# Patient Record
Sex: Female | Born: 1955
Health system: Southern US, Community
[De-identification: ages and names within clinical notes are randomized; demographics above are authoritative.]

## PROBLEM LIST (undated history)

## (undated) DIAGNOSIS — Z801 Family history of malignant neoplasm of trachea, bronchus and lung: Secondary | ICD-10-CM

## (undated) DIAGNOSIS — Z8042 Family history of malignant neoplasm of prostate: Secondary | ICD-10-CM

## (undated) DIAGNOSIS — Z800 Family history of malignant neoplasm of digestive organs: Secondary | ICD-10-CM

## (undated) DIAGNOSIS — E039 Hypothyroidism, unspecified: Secondary | ICD-10-CM

## (undated) DIAGNOSIS — E7800 Pure hypercholesterolemia, unspecified: Secondary | ICD-10-CM

## (undated) DIAGNOSIS — Z8051 Family history of malignant neoplasm of kidney: Secondary | ICD-10-CM

## (undated) DIAGNOSIS — Z808 Family history of malignant neoplasm of other organs or systems: Secondary | ICD-10-CM

## (undated) DIAGNOSIS — Z8041 Family history of malignant neoplasm of ovary: Secondary | ICD-10-CM

## (undated) DIAGNOSIS — Z806 Family history of leukemia: Secondary | ICD-10-CM

## (undated) HISTORY — DX: Family history of malignant neoplasm of kidney: Z80.51

## (undated) HISTORY — PX: TUBAL LIGATION: SHX77

## (undated) HISTORY — DX: Family history of malignant neoplasm of trachea, bronchus and lung: Z80.1

## (undated) HISTORY — DX: Family history of malignant neoplasm of other organs or systems: Z80.8

## (undated) HISTORY — DX: Family history of malignant neoplasm of prostate: Z80.42

## (undated) HISTORY — DX: Hypothyroidism, unspecified: E03.9

## (undated) HISTORY — DX: Family history of malignant neoplasm of ovary: Z80.41

## (undated) HISTORY — DX: Family history of malignant neoplasm of digestive organs: Z80.0

## (undated) HISTORY — DX: Family history of leukemia: Z80.6

---

## 1999-09-28 ENCOUNTER — Encounter: Payer: Self-pay | Admitting: Neurosurgery

## 1999-10-01 ENCOUNTER — Encounter: Payer: Self-pay | Admitting: Neurosurgery

## 1999-10-01 ENCOUNTER — Ambulatory Visit (HOSPITAL_COMMUNITY): Admission: RE | Admit: 1999-10-01 | Discharge: 1999-10-02 | Payer: Self-pay | Admitting: Neurosurgery

## 1999-10-10 ENCOUNTER — Emergency Department (HOSPITAL_COMMUNITY): Admission: EM | Admit: 1999-10-10 | Discharge: 1999-10-10 | Payer: Self-pay | Admitting: Emergency Medicine

## 2000-02-02 HISTORY — PX: BACK SURGERY: SHX140

## 2002-12-10 ENCOUNTER — Emergency Department (HOSPITAL_COMMUNITY): Admission: AD | Admit: 2002-12-10 | Discharge: 2002-12-10 | Payer: Self-pay | Admitting: Family Medicine

## 2008-02-21 ENCOUNTER — Ambulatory Visit (HOSPITAL_COMMUNITY): Admission: RE | Admit: 2008-02-21 | Discharge: 2008-02-21 | Payer: Self-pay | Admitting: Family Medicine

## 2009-04-23 ENCOUNTER — Ambulatory Visit (HOSPITAL_COMMUNITY): Admission: RE | Admit: 2009-04-23 | Discharge: 2009-04-23 | Payer: Self-pay | Admitting: Family Medicine

## 2009-09-15 ENCOUNTER — Ambulatory Visit (HOSPITAL_COMMUNITY): Admission: RE | Admit: 2009-09-15 | Discharge: 2009-09-15 | Payer: Self-pay | Admitting: Family Medicine

## 2010-06-19 NOTE — Op Note (Signed)
[place]  Patient:    Dana Holder, Dana Holder                          MRN: [id number] Proc. Date: 11/12/99 Adm. Date:  [id number] Disc. Date: [id number] Attending:  Annamarie Dawley                           Operative Report  PREOPERATIVE DIAGNOSES:  Severe degenerative disk disease L5-S1 with mechanical instability and bilateral S1 radiculopathy.  POSTOPERATIVE DIAGNOSES:  Severe degenerative disk disease L5-S1 with mechanical instability and bilateral S1 radiculopathy.  PROCEDURE:  Decompressive lumbar laminectomy at L5-S1, microscopic diskectomy at L5-S1, microscopic dissection of the l5 and S1 nerve roots bilaterally, tangent allograft interbody arthrodesis using 8 x 26 mm wedges, posterolateral arthrodesis and screw placement using six 5 x 40 screws in the sacrum and six 5 x 45 screws in L5 and 1/4 inch rods.  SURGEON:  Dr. Donalee Citrin.  FIRST ASSISTANT:  Dr. Aliene Beams.  ANESTHESIA:  General endotracheal, IV fluids.  HISTORY OF PRESENT ILLNESS:  The patient is a very pleasant 55 year old female whose had longstanding low back pain that has gotten acutely worse in the lAST couple of months whose failed all measures of anti-inflammatories and physical therapy. Her pain was severely debilitating. I essentially went over her options with her and has agreed to proceed with surgery. Her symptomatology is back greater than leg pain with bilateral S1 radiculopathy and severe pain in her back that is worse with axial loading. The patient was brought to the OR where ______ joint anesthesia was administered.  Prone on the Wilson frame, her back was prepped and draped in a sterile fashion. An incision was made a 20 blade scalpel, low lithotomy incision was taken down the subcutaneous tissue. Subperiosteal dissection was carried out in the lamina of the L5 and S1 bilaterally. Intraoperative x-ray confirmed the L5-S1 interspace. Subperiosteal dissection was  carried out in the lateral aspect exposing the TPs of L5 and S1 bilaterally and a Leksell rongeur was used to removed the spinous process of L5 and the remainder of the laminectomy at L5 was completed with free implement of the Kerrison punches and the ______ drill bit was also used to facilitate the medial facetectomy. Part of the superior aspect of the S1 lamina was also removed. The ligamentum flavum was removed, dura was visualized. The remainder of the medial fascial joint was continued using 3 and 4 mm Kerrison punches to complete the complete medial facetectomy. The L5 nerve root was identified, the foraminotomy was completed with a 3 mm Kerrison punch as well as the S1 nerve root. Attention was then taken to pedicle screw placement. The fluoroscopy was brought in using fluoroscopic guidance ______. Pilot holes were drilled in the L5 an S1 pedicle screw holes bilaterally. A gear shift was used to probe out the pedicle, tapped with a 5 bite tap and ______ followed by 45 screws and placed at L5 and six 5 x 40 screws placed at S1, all confirmed by good localization with the fluoroscopy and all pedicle holes probed with the pedicle probe to ensure competence in all sites. After the screw placed, the operating microscope was draped and brought in the field and under microscopic illumination the abdomen was ______ with bipolar electrocautery over the interspace with L5-S1. The remainder of the L5 and S1 foraminotomies were continued with a 3 mm  Kerrison punch. The interspace was entered and using an 8 mm distractor placed on the interspace on the left. The right side of the L5-S1 interspace was cleaned out with the small cutter and chisel sequentially to prepare the endplate and then an 8 mm x 26 mm change in allograft was inserted. Then the distractor was removed from the patients left side and then this interspace was also cleaned out using small 8 mm cutter and chisel as well. Down  going Epstein curette and ream curette were used to clean out the remainder of the medial aspect of the interspace. The endplates were prepared, the remainder of the disk fragments were removed and the 8 mm x 26 mm change in allograft was soaked. Autograft was packed against the opposite allograft and then after the autograft was packed medially, the allograft was inserted on the patients left side. These ______ approximately 2-3 mm deep in the posterior margin of the vertebral body confirmed location with fluoroscopy. The wound was copiously irrigated and the autograft was then packed along the posterolateral aspect over the transverse process of L5 and S1 bilaterally. Rods were inserted ______ were anchored down. The L5 ______ was compressed against S1. These  ______ were also tied at L5. A median Hemovac drain was placed. Hemostasis was maintained. Gelfoam was overlaid on top of the dura and the wound was closed with #0 Vicryl in simple fashion, 2-0 Vicryl in the subcutaneous tissue and the skin was closed with a running 4-0 subcuticular. Benzoin and Steri-Strips were applied at the end of the case. Sponge, needle and instrument counts were correct. The patient went to the recovery room in stable condition. DD:  11/12/99 TD:  11/13/99 Job: [id number]

## 2010-11-13 ENCOUNTER — Encounter: Payer: Self-pay | Admitting: *Deleted

## 2010-11-13 ENCOUNTER — Emergency Department (HOSPITAL_COMMUNITY): Payer: 59

## 2010-11-13 ENCOUNTER — Emergency Department (HOSPITAL_COMMUNITY)
Admission: EM | Admit: 2010-11-13 | Discharge: 2010-11-13 | Disposition: A | Discharge status: Home / Self Care | Payer: 59 | Attending: Emergency Medicine | Admitting: Emergency Medicine

## 2010-11-13 ENCOUNTER — Ambulatory Visit (HOSPITAL_COMMUNITY)
Admission: RE | Admit: 2010-11-13 | Discharge: 2010-11-13 | Disposition: A | Discharge status: Home / Self Care | Payer: 59 | Source: Ambulatory Visit | Attending: Nurse Practitioner | Admitting: Nurse Practitioner

## 2010-11-13 DIAGNOSIS — N949 Unspecified condition associated with female genital organs and menstrual cycle: Secondary | ICD-10-CM | POA: Insufficient documentation

## 2010-11-13 DIAGNOSIS — D259 Leiomyoma of uterus, unspecified: Secondary | ICD-10-CM | POA: Insufficient documentation

## 2010-11-13 DIAGNOSIS — Z87891 Personal history of nicotine dependence: Secondary | ICD-10-CM | POA: Insufficient documentation

## 2010-11-13 DIAGNOSIS — N938 Other specified abnormal uterine and vaginal bleeding: Secondary | ICD-10-CM | POA: Insufficient documentation

## 2010-11-13 LAB — CBC
HCT: 38.5 % (ref 36.0–46.0)
Hemoglobin: 13.1 g/dL (ref 12.0–15.0)
MCHC: 34 g/dL (ref 30.0–36.0)
MCV: 90.2 fL (ref 78.0–100.0)
RDW: 13.3 % (ref 11.5–15.5)
WBC: 9.5 10*3/uL (ref 4.0–10.5)

## 2010-11-13 LAB — POCT PREGNANCY, URINE: Preg Test, Ur: NEGATIVE

## 2010-11-13 LAB — WET PREP, GENITAL
Clue Cells Wet Prep HPF POC: NONE SEEN
Yeast Wet Prep HPF POC: NONE SEEN

## 2010-11-13 MED ORDER — MEGESTROL ACETATE 40 MG PO TABS: 40.0000 mg | ORAL_TABLET | Freq: Three times a day (TID) | ORAL | Status: AC

## 2010-11-13 NOTE — ED Provider Notes (Signed)
History     CSN: [id number] Arrival date & time: 11/13/2010  2:07 PM  Chief Complaint  Patient presents with  . Vaginal Bleeding    HPI Dana Holder is a 55 y.o. female who presents to the ED for heavy vaginal bleeding that started yesterday. Stopped having periods two years ago. The bleeding started yesterday as light and then today has gotten heavier.  Went to Dr. Phillips Odor today and was sent to the ED. Patient states she had a sister that died of cervical cancer. Feeling tired the past few days. Hx of lesions on the cervix that were removed by Dr. Emelda Fear in the past. No recent problems. The history was provided by the patient.  History reviewed. No pertinent past medical history.  Past Surgical History  Procedure Date  . Back surgery   . Tubal ligation     No family history on file.  History  Substance Use Topics  . Smoking status: Former Games developer  . Smokeless tobacco: Not on file  . Alcohol Use: No    OB History    Grav Para Term Preterm Abortions TAB SAB Ect Mult Living                  Review of Systems  Constitutional: Positive for fatigue. Negative for fever, chills and diaphoresis.  HENT: Negative for ear pain, congestion, sore throat, facial swelling, neck pain, neck stiffness, dental problem and sinus pressure.   Eyes: Negative for photophobia, pain and discharge.  Respiratory: Negative for cough, chest tightness and wheezing.   Cardiovascular: Negative.   Gastrointestinal: Positive for abdominal pain and abdominal distention. Negative for nausea, vomiting, diarrhea and constipation.  Genitourinary: Positive for vaginal bleeding, vaginal discharge and pelvic pain. Negative for dysuria, frequency, flank pain, difficulty urinating and vaginal pain.  Musculoskeletal: Positive for back pain. Negative for myalgias and gait problem.  Skin: Negative for color change and rash.  Neurological: Negative for dizziness, speech difficulty, weakness, light-headedness,  numbness and headaches.  Psychiatric/Behavioral: Negative for confusion and agitation.    Allergies  Review of patient's allergies indicates no known allergies.  Home Medications  No current outpatient prescriptions on file.  BP 129/71  Pulse 64  Temp(Src) 98.4 F (36.9 C) (Oral)  Resp 20  Ht 5\' 5"  (1.651 m)  Wt 249 lb (112.946 kg)  BMI 41.44 kg/m2  SpO2 99%  Physical Exam  Nursing note and vitals reviewed. Constitutional: She is oriented to person, place, and time. She appears well-developed and well-nourished.  HENT:  Head: Normocephalic.  Eyes: EOM are normal.  Neck: Neck supple.  Pulmonary/Chest: Effort normal.  Abdominal: Soft.       Minimal tenderness lower abdomen.  Genitourinary:       External genitalia without lesions.  Moderate blood vaginal vault, no cervical motion tenderness. Uterus enlarged.  Musculoskeletal: Normal range of motion.  Neurological: She is alert and oriented to person, place, and time. No cranial nerve deficit.  Skin: Skin is warm and dry.    ED Course  Procedures (including critical care time) US Transvaginal Non-ob  11/13/2010  *RADIOLOGY REPORT*  Clinical Data: Heavy post menopausal vaginal bleeding. History of uterine fibroids. No hormone replacement therapy.  TRANSABDOMINAL AND TRANSVAGINAL ULTRASOUND OF PELVIS Technique:  Both transabdominal and transvaginal ultrasound examinations of the pelvis were performed. Transabdominal technique was performed for global imaging of the pelvis including uterus, ovaries, adnexal regions, and pelvic cul-de-sac.  Comparison: Pelvic ultrasound 02/21/2008.   It was necessary to proceed with endovaginal  exam following the transabdominal exam to visualize the uterus and ovaries to better advantage.  Findings:  Uterus: Measures 12.1 x 6.1 x 7.5 cm.  There is diffuse myometrial heterogeneity with several intramural fibroids.  The largest is located in the right fundal region, measuring 3.5 x 3.2 x 2.2 cm. No  exophytic fibroids are identified.  Endometrium: Heterogeneously thickened to 1.6 cm.  There is associated blood flow with color Doppler.  Right ovary:  Suboptimally visualized but grossly normal in appearance, measuring 2.4 x 2.0 x 2.5 cm.  Left ovary: Only visualized on the transabdominal study.  No demonstrated abnormality.  Measures 3.4 x 2.7 x 2.2 cm.  Other findings: No free fluid  IMPRESSION:  1.  Abnormal thickening of the endometrium for a post menopausal patient with vaginal bleeding and no hormone replacement therapy. Endometrial hyperplasia and carcinoma cannot be excluded.  Tissue sampling is recommended. 2.  Enlarged uterus with multiple fibroids similar to prior examinations. 3.  No adnexal abnormalities identified.  Original Report Authenticated By: Gerrianne Scale, M.D.   US Pelvis Complete  11/13/2010  *RADIOLOGY REPORT*  Clinical Data: Heavy post menopausal vaginal bleeding. History of uterine fibroids. No hormone replacement therapy.  TRANSABDOMINAL AND TRANSVAGINAL ULTRASOUND OF PELVIS Technique:  Both transabdominal and transvaginal ultrasound examinations of the pelvis were performed. Transabdominal technique was performed for global imaging of the pelvis including uterus, ovaries, adnexal regions, and pelvic cul-de-sac.  Comparison: Pelvic ultrasound 02/21/2008.   It was necessary to proceed with endovaginal exam following the transabdominal exam to visualize the uterus and ovaries to better advantage.  Findings:  Uterus: Measures 12.1 x 6.1 x 7.5 cm.  There is diffuse myometrial heterogeneity with several intramural fibroids.  The largest is located in the right fundal region, measuring 3.5 x 3.2 x 2.2 cm. No exophytic fibroids are identified.  Endometrium: Heterogeneously thickened to 1.6 cm.  There is associated blood flow with color Doppler.  Right ovary:  Suboptimally visualized but grossly normal in appearance, measuring 2.4 x 2.0 x 2.5 cm.  Left ovary: Only visualized on the  transabdominal study.  No demonstrated abnormality.  Measures 3.4 x 2.7 x 2.2 cm.  Other findings: No free fluid  IMPRESSION:  1.  Abnormal thickening of the endometrium for a post menopausal patient with vaginal bleeding and no hormone replacement therapy. Endometrial hyperplasia and carcinoma cannot be excluded.  Tissue sampling is recommended. 2.  Enlarged uterus with multiple fibroids similar to prior examinations. 3.  No adnexal abnormalities identified.  Original Report Authenticated By: Gerrianne Scale, M.D.   Results for orders placed during the hospital encounter of 11/13/10 (from the past 24 hour(s))  CBC     Status: Normal   Collection Time   11/13/10  3:05 PM      Component Value Range   WBC 9.5  4.0 - 10.5 (K/uL)   RBC 4.27  3.87 - 5.11 (MIL/uL)   Hemoglobin 13.1  12.0 - 15.0 (g/dL)   HCT 13.2  44.0 - 10.2 (%)   MCV 90.2  78.0 - 100.0 (fL)   MCH 30.7  26.0 - 34.0 (pg)   MCHC 34.0  30.0 - 36.0 (g/dL)   RDW 72.5  36.6 - 44.0 (%)   Platelets 230  150 - 400 (K/uL)  WET PREP, GENITAL     Status: Abnormal   Collection Time   11/13/10  3:28 PM      Component Value Range   Yeast, Wet Prep NONE SEEN  NONE SEEN  Trich, Wet Prep NONE SEEN  NONE SEEN    Clue Cells, Wet Prep NONE SEEN  NONE SEEN    WBC, Wet Prep HPF POC FEW (*) NONE SEEN   POCT PREGNANCY, URINE     Status: Normal   Collection Time   11/13/10  5:19 PM      Component Value Range   Preg Test, Ur NEGATIVE     Assessment: dysfunctional uterine bleeding   Uterine fibroids  Plan:  Consult with Dr. Despina Hidden   Megace 40 mg. Po tid #30   Appointment in the office Monday - pt. To call  MDM   Kerrie Buffalo, NP 11/13/10 [address] [place], NP 11/14/10 [phone]

## 2010-11-13 NOTE — ED Notes (Signed)
Taken for ultrasound.  Stable

## 2010-11-13 NOTE — ED Notes (Signed)
Vaginal bleeding onset last night, states is is like a heavy period

## 2010-11-13 NOTE — ED Notes (Signed)
States Dr Phillips Odor told her she would get an IV to stop the bleeding

## 2010-11-13 NOTE — ED Notes (Signed)
States her last period up until this time was two years ago--began having "heavy" vaginal bleeding last p.m.--Has used over 8 pads today and c/o cramping--rates pain 3 on 1-10 scale---Sent here from her MD's office for further evaluation.

## 2010-11-14 LAB — GC/CHLAMYDIA PROBE AMP, GENITAL: Chlamydia, DNA Probe: NEGATIVE

## 2010-11-17 NOTE — ED Provider Notes (Signed)
Medical screening examination/treatment/procedure(s) were performed by non-physician practitioner and as supervising physician I was immediately available for consultation/collaboration.  Hurman Horn, MD 11/17/10 [phone]

## 2010-12-13 ENCOUNTER — Other Ambulatory Visit: Payer: Self-pay | Admitting: Obstetrics & Gynecology

## 2010-12-22 ENCOUNTER — Encounter (HOSPITAL_COMMUNITY)
Admission: RE | Admit: 2010-12-22 | Discharge: 2010-12-22 | Disposition: A | Discharge status: Home / Self Care | Payer: 59 | Source: Ambulatory Visit | Attending: Obstetrics & Gynecology | Admitting: Obstetrics & Gynecology

## 2010-12-22 ENCOUNTER — Encounter (HOSPITAL_COMMUNITY): Payer: Self-pay

## 2010-12-22 ENCOUNTER — Encounter (HOSPITAL_COMMUNITY): Payer: Self-pay | Admitting: Pharmacy Technician

## 2010-12-22 LAB — CBC
Hemoglobin: 12.5 g/dL (ref 12.0–15.0)
MCHC: 33 g/dL (ref 30.0–36.0)
RBC: 4.22 MIL/uL (ref 3.87–5.11)

## 2010-12-22 LAB — COMPREHENSIVE METABOLIC PANEL
ALT: 9 U/L (ref 0–35)
Alkaline Phosphatase: 70 U/L (ref 39–117)
GFR calc Af Amer: 75 mL/min — ABNORMAL LOW (ref >90)
Glucose, Bld: 105 mg/dL — ABNORMAL HIGH (ref 70–99)
Potassium: 4.4 mEq/L (ref 3.5–5.1)
Sodium: 141 mEq/L (ref 135–145)
Total Protein: 6.8 g/dL (ref 6.0–8.3)

## 2010-12-22 LAB — URINALYSIS, ROUTINE W REFLEX MICROSCOPIC
Bilirubin Urine: NEGATIVE
Glucose, UA: NEGATIVE mg/dL
Specific Gravity, Urine: 1.02 (ref 1.005–1.030)
Urobilinogen, UA: 0.2 mg/dL (ref 0.0–1.0)
pH: 6 (ref 5.0–8.0)

## 2010-12-22 LAB — URINE MICROSCOPIC-ADD ON

## 2010-12-22 NOTE — Patient Instructions (Addendum)
[address] AEMILIA DEDRICK  12/22/2010   Your procedure is scheduled on: 12-30-2010  Report to [facility] at  900 AM.  Call this number if you have problems the morning of surgery: [phone]   Remember:   Do not eat food:After Midnight.  Do not drink clear liquids: After Midnight.  Take these medicines the morning of surgery with A SIP OF WATER: none   Do not wear jewelry, make-up or nail polish.  Do not wear lotions, powders, or perfumes. You may wear deodorant.  Do not shave 48 hours prior to surgery.  Do not bring valuables to the hospital.  Contacts, dentures or bridgework may not be worn into surgery.  Leave suitcase in the car. After surgery it may be brought to your room.  For patients admitted to the hospital, checkout time is 11:00 AM the day of discharge.   Patients discharged the day of surgery will not be allowed to drive home.  Name and phone number of your driver: family Special Instructions: CHG Shower Use Special Wash: 1/2 bottle night before surgery and 1/2 bottle morning of surgery.   Please read over the following fact sheets that you were given: Pain Booklet, MRSA Information, Surgical Site Infection Prevention, Anesthesia Post-op Instructions and Care and Recovery After Surgery Endometrial Ablation Endometrial ablation removes the lining of the uterus (endometrium). It is usually a same day, outpatient treatment. Ablation helps avoid major surgery (such as a hysterectomy). A hysterectomy is removal of the cervix and uterus. Endometrial ablation has less risk and complications, has a shorter recovery period and is less expensive. After endometrial ablation, most women will have little or no menstrual bleeding. You may not keep your fertility. Pregnancy is no longer likely after this procedure but if you are pre-menopausal, you still need to use a reliable method of birth control following the procedure because pregnancy can occur. REASONS TO HAVE THE PROCEDURE MAY  INCLUDE:  Heavy periods.   Bleeding that is causing anemia.   Anovulatory bleeding, very irregular, bleeding.   Bleeding submucous fibroids (on the lining inside the uterus) if they are smaller than 3 centimeters.  REASONS NOT TO HAVE THE PROCEDURE MAY INCLUDE:  You wish to have more children.   You have a pre-cancerous or cancerous problem. The cause of any abnormal bleeding must be diagnosed before having the procedure.   You have pain coming from the uterus.   You have a submucus fibroid larger than 3 centimeters.   You recently had a baby.   You recently had an infection in the uterus.   You have a severe retro-flexed, tipped uterus and cannot insert the instrument to do the ablation.   You had a Cesarean section or deep major surgery on the uterus.   The inner cavity of the uterus is too large for the endometrial ablation instrument.  RISKS AND COMPLICATIONS   Perforation of the uterus.   Bleeding.   Infection of the uterus, bladder or vagina.   Injury to surrounding organs.   Cutting the cervix.   An air bubble to the lung (air embolus).   Pregnancy following the procedure.   Failure of the procedure to help the problem requiring hysterectomy.   Decreased ability to diagnose cancer in the lining of the uterus.  BEFORE THE PROCEDURE  The lining of the uterus must be tested to make sure there is no pre-cancerous or cancer cells present.   Medications may be given to make the lining of the uterus  thinner.   Ultrasound may be used to evaluate the size and look for abnormalities of the uterus.   Future pregnancy is not desired.  PROCEDURE  There are different ways to destroy the lining of the uterus.   Resectoscope - radio frequency-alternating electric current is the most common one used.   Cryotherapy - freezing the lining of the uterus.   Heated Free Liquid - heated salt (saline) solution inserted into the uterus.   Microwave - uses high energy  microwaves in the uterus.   Thermal Balloon - a catheter with a balloon tip is inserted into the uterus and filled with heated fluid.  Your caregiver will talk with you about the method used in this clinic. They will also instruct you on the pros and cons of the procedure. Endometrial ablation is performed along with a procedure called operative hysteroscopy. A narrow viewing tube is inserted through the birth canal (vagina) and through the cervix into the uterus. A tiny camera attached to the viewing tube (hysteroscope) allows the uterine cavity to be shown on a TV monitor during surgery. Your uterus is filled with a harmless liquid to make the procedure easier. The lining of the uterus is then removed. The lining can also be removed with a resectoscope which allows your surgeon to cut away the lining of the uterus under direct vision. Usually, you will be able to go home within an hour after the procedure. HOME CARE INSTRUCTIONS   Do not drive for 24 hours.   No tampons, douching or intercourse for 2 weeks or until your caregiver approves.   Rest at home for 24 to 48 hours. You may then resume normal activities unless told differently by your caregiver.   Take your temperature two times a day for 4 days, and record it.   Take any medications your caregiver has ordered, as directed.   Use some form of contraception if you are pre-menopausal and do not want to get pregnant.  Bleeding after the procedure is normal. It varies from light spotting and mildly watery to bloody discharge for 4 to 6 weeks. You may also have mild cramping. Only take over-the-counter or prescription medicines for pain, discomfort, or fever as directed by your caregiver. Do not use aspirin, as this may aggravate bleeding. Frequent urination during the first 24 hours is normal. You will not know how effective your surgery is until at least 3 months after the surgery. SEEK IMMEDIATE MEDICAL CARE IF:   Bleeding is heavier than  a normal menstrual cycle.   An oral temperature above 102 F (38.9 C) develops.   You have increasing cramps or pains not relieved with medication or develop belly (abdominal) pain which does not seem to be related to the same area of earlier cramping and pain.   You are light headed, weak or have fainting episodes.   You develop pain in the shoulder strap areas.   You have chest or leg pain.   You have abnormal vaginal discharge.   You have painful urination.  Document Released: 11/28/2003 Document Revised: 09/30/2010 Document Reviewed: 02/25/2007 [facility] Patient Information 2012 [place], [place].PATIENT INSTRUCTIONS POST-ANESTHESIA  IMMEDIATELY FOLLOWING SURGERY:  Do not drive or operate machinery for the first twenty four hours after surgery.  Do not make any important decisions for twenty four hours after surgery or while taking narcotic pain medications or sedatives.  If you develop intractable nausea and vomiting or a severe headache please notify your doctor immediately.  FOLLOW-UP:  Please  make an appointment with your surgeon as instructed. You do not need to follow up with anesthesia unless specifically instructed to do so.  WOUND CARE INSTRUCTIONS (if applicable):  Keep a dry clean dressing on the anesthesia/puncture wound site if there is drainage.  Once the wound has quit draining you may leave it open to air.  Generally you should leave the bandage intact for twenty four hours unless there is drainage.  If the epidural site drains for more than 36-48 hours please call the anesthesia department.  QUESTIONS?:  Please feel free to call your physician or the hospital operator if you have any questions, and they will be happy to assist you.     [facility] Anesthesia Department [address] [place] [phone]

## 2010-12-30 ENCOUNTER — Encounter (HOSPITAL_COMMUNITY)
Admission: RE | Disposition: A | Discharge status: Home / Self Care | Payer: Self-pay | Source: Ambulatory Visit | Attending: Obstetrics & Gynecology

## 2010-12-30 ENCOUNTER — Encounter (HOSPITAL_COMMUNITY): Payer: Self-pay | Admitting: Anesthesiology

## 2010-12-30 ENCOUNTER — Encounter (HOSPITAL_COMMUNITY): Payer: Self-pay | Admitting: *Deleted

## 2010-12-30 ENCOUNTER — Ambulatory Visit (HOSPITAL_COMMUNITY): Payer: 59 | Admitting: Anesthesiology

## 2010-12-30 ENCOUNTER — Other Ambulatory Visit: Payer: Self-pay | Admitting: Obstetrics & Gynecology

## 2010-12-30 ENCOUNTER — Ambulatory Visit (HOSPITAL_COMMUNITY)
Admission: RE | Admit: 2010-12-30 | Discharge: 2010-12-30 | Disposition: A | Discharge status: Home / Self Care | Payer: 59 | Source: Ambulatory Visit | Attending: Obstetrics & Gynecology | Admitting: Obstetrics & Gynecology

## 2010-12-30 DIAGNOSIS — N946 Dysmenorrhea, unspecified: Secondary | ICD-10-CM | POA: Insufficient documentation

## 2010-12-30 DIAGNOSIS — Z9889 Other specified postprocedural states: Secondary | ICD-10-CM

## 2010-12-30 DIAGNOSIS — N950 Postmenopausal bleeding: Secondary | ICD-10-CM | POA: Insufficient documentation

## 2010-12-30 SURGERY — DILATATION & CURETTAGE/HYSTEROSCOPY WITH THERMACHOICE ABLATION
Anesthesia: General | Site: Uterus | Wound class: Clean Contaminated

## 2010-12-30 MED ORDER — KETOROLAC TROMETHAMINE 10 MG PO TABS: 10.0000 mg | ORAL_TABLET | Freq: Three times a day (TID) | ORAL | Status: AC | PRN

## 2010-12-30 MED ORDER — ONDANSETRON HCL 8 MG PO TABS: 8.0000 mg | ORAL_TABLET | Freq: Three times a day (TID) | ORAL | Status: AC | PRN

## 2010-12-30 MED ORDER — HYDROCODONE-ACETAMINOPHEN 5-500 MG PO TABS: 1.0000 | ORAL_TABLET | Freq: Four times a day (QID) | ORAL | Status: AC | PRN

## 2010-12-30 MED ORDER — SODIUM CHLORIDE 0.9 % IR SOLN
Status: DC | PRN
  Administered 2010-12-30: 3000 mL

## 2010-12-30 MED ORDER — KETOROLAC TROMETHAMINE 30 MG/ML IJ SOLN
30.0000 mg | Freq: Once | INTRAMUSCULAR | Status: AC
  Administered 2010-12-30: 30 mg via INTRAVENOUS

## 2010-12-30 MED ORDER — FENTANYL CITRATE 0.05 MG/ML IJ SOLN
25.0000 ug | INTRAMUSCULAR | Status: DC | PRN
  Administered 2010-12-30 (×3): 50 ug via INTRAVENOUS

## 2010-12-30 MED ORDER — MIDAZOLAM HCL 2 MG/2ML IJ SOLN
INTRAMUSCULAR | Status: AC
  Filled 2010-12-30: qty 2

## 2010-12-30 MED ORDER — LIDOCAINE HCL 1 % IJ SOLN
INTRAMUSCULAR | Status: DC | PRN
  Administered 2010-12-30: 25 mg via INTRADERMAL

## 2010-12-30 MED ORDER — PROPOFOL 10 MG/ML IV EMUL
INTRAVENOUS | Status: DC | PRN
  Administered 2010-12-30: 150 mL via INTRAVENOUS

## 2010-12-30 MED ORDER — FENTANYL CITRATE 0.05 MG/ML IJ SOLN
INTRAMUSCULAR | Status: AC
  Administered 2010-12-30: 50 ug via INTRAVENOUS
  Filled 2010-12-30: qty 2

## 2010-12-30 MED ORDER — CEFAZOLIN SODIUM 1-5 GM-% IV SOLN
INTRAVENOUS | Status: AC
  Filled 2010-12-30: qty 50

## 2010-12-30 MED ORDER — MIDAZOLAM HCL 5 MG/5ML IJ SOLN
INTRAMUSCULAR | Status: DC | PRN
  Administered 2010-12-30: 2 mg via INTRAVENOUS

## 2010-12-30 MED ORDER — LACTATED RINGERS IV SOLN
INTRAVENOUS | Status: DC
  Administered 2010-12-30: 11:00:00 via INTRAVENOUS

## 2010-12-30 MED ORDER — FENTANYL CITRATE 0.05 MG/ML IJ SOLN
INTRAMUSCULAR | Status: DC | PRN
  Administered 2010-12-30 (×2): 50 ug via INTRAVENOUS

## 2010-12-30 MED ORDER — MIDAZOLAM HCL 2 MG/2ML IJ SOLN
1.0000 mg | INTRAMUSCULAR | Status: DC | PRN
  Administered 2010-12-30: 2 mg via INTRAVENOUS

## 2010-12-30 MED ORDER — KETOROLAC TROMETHAMINE 30 MG/ML IJ SOLN
INTRAMUSCULAR | Status: AC
  Filled 2010-12-30: qty 1

## 2010-12-30 MED ORDER — CEFAZOLIN SODIUM 1-5 GM-% IV SOLN
INTRAVENOUS | Status: DC | PRN
  Administered 2010-12-30: 1 g via INTRAVENOUS

## 2010-12-30 MED ORDER — DEXTROSE 5 % IV SOLN
INTRAVENOUS | Status: DC | PRN
  Administered 2010-12-30: 46 mL via INTRAVENOUS

## 2010-12-30 MED ORDER — PROPOFOL 10 MG/ML IV EMUL
INTRAVENOUS | Status: AC
  Filled 2010-12-30: qty 20

## 2010-12-30 MED ORDER — ONDANSETRON HCL 4 MG/2ML IJ SOLN: 4.0000 mg | Freq: Once | INTRAMUSCULAR | Status: DC | PRN

## 2010-12-30 SURGICAL SUPPLY — 29 items
BAG DECANTER FOR FLEXI CONT (MISCELLANEOUS) ×2 IMPLANT
BAG HAMPER (MISCELLANEOUS) ×2 IMPLANT
CATH THERMACHOICE III (CATHETERS) ×2 IMPLANT
CLOTH BEACON ORANGE TIMEOUT ST (SAFETY) ×2 IMPLANT
COVER LIGHT HANDLE STERIS (MISCELLANEOUS) ×4 IMPLANT
FORMALIN 10 PREFIL 120ML (MISCELLANEOUS) ×2 IMPLANT
GAUZE SPONGE 4X4 16PLY XRAY LF (GAUZE/BANDAGES/DRESSINGS) ×2 IMPLANT
GLOVE BIOGEL PI IND STRL 7.0 (GLOVE) ×1 IMPLANT
GLOVE BIOGEL PI IND STRL 8 (GLOVE) ×1 IMPLANT
GLOVE BIOGEL PI INDICATOR 7.0 (GLOVE) ×1
GLOVE BIOGEL PI INDICATOR 8 (GLOVE) ×1
GLOVE ECLIPSE 6.5 STRL STRAW (GLOVE) ×2 IMPLANT
GLOVE ECLIPSE 8.0 STRL XLNG CF (GLOVE) ×2 IMPLANT
GOWN STRL REIN XL XLG (GOWN DISPOSABLE) ×2 IMPLANT
INST SET HYSTEROSCOPY (KITS) ×2 IMPLANT
IV D5W 500ML (IV SOLUTION) ×2 IMPLANT
IV NS IRRIG 3000ML ARTHROMATIC (IV SOLUTION) ×2 IMPLANT
KIT ROOM TURNOVER APOR (KITS) ×2 IMPLANT
MANIFOLD NEPTUNE II (INSTRUMENTS) ×2 IMPLANT
MARKER SKIN DUAL TIP RULER LAB (MISCELLANEOUS) ×2 IMPLANT
NS IRRIG 1000ML POUR BTL (IV SOLUTION) ×2 IMPLANT
PACK BASIC III (CUSTOM PROCEDURE TRAY) ×1
PACK SRG BSC III STRL LF ECLPS (CUSTOM PROCEDURE TRAY) ×1 IMPLANT
PAD ARMBOARD 7.5X6 YLW CONV (MISCELLANEOUS) ×2 IMPLANT
PAD TELFA 3X4 1S STER (GAUZE/BANDAGES/DRESSINGS) ×2 IMPLANT
SET BASIN LINEN APH (SET/KITS/TRAYS/PACK) ×2 IMPLANT
SET IRRIG Y TYPE TUR BLADDER L (SET/KITS/TRAYS/PACK) ×2 IMPLANT
SHEET LAVH (DRAPES) ×2 IMPLANT
YANKAUER SUCT BULB TIP 10FT TU (MISCELLANEOUS) ×2 IMPLANT

## 2010-12-30 NOTE — Op Note (Signed)
Preoperative diagnosis:  Post menopausal bleeding                                         Endometrial polyp on office biopsy                                         Menometrorrhagia                                        Dysmenorrhea   Postoperative diagnoses: Same as above   Procedure: Hysteroscopy, uterine curettage, endometrial ablation  Surgeon: Despina Hidden MD  Anesthesia: Laryngeal mask airway  Findings: The endometrium was normal. There were no fibroid or other abnormalities.  Description of operation: The patient was taken to the operating room and placed in the supine position. She underwent general anesthesia using the laryngeal mask airway. She was placed in the dorsal lithotomy position and prepped and draped in the usual sterile fashion. A Graves speculum was placed and the anterior cervical lip was grasped with a single-tooth tenaculum. The cervix was dilated serially to allow passage of the hysteroscope. Diagnostic hysteroscopy was performed and was found to be normal. A vigorous uterine curettage was then performed and all tissue sent to pathology for evaluation. The ThermaChoice 3 endometrial ablation balloon was then used were 46 cc of D5W was required to maintain a pressure of 190-200 mm of mercury throughout the procedure. All of the equipment worked well throughout the procedure. All of the fluid was returned at the end of the procedure. Total therapy time was 14 minutes 0 seconds.  The patient was awakened from anesthesia and taken to the recovery room in good stable condition all counts were correct. She received 1 g of Ancef and 30 mg of Toradol preoperatively. She will be discharged from the recovery room and followed up in the office next week.  Tauheed Mcfayden H 12:16 PM 12/30/2010

## 2010-12-30 NOTE — Anesthesia Procedure Notes (Signed)
Procedure Name: LMA Insertion Date/Time: 12/30/2010 11:32 AM Performed by: Despina Hidden Pre-anesthesia Checklist: Patient identified, Patient being monitored, Emergency Drugs available and Suction available Patient Re-evaluated:Patient Re-evaluated prior to inductionOxygen Delivery Method: Circle System Utilized Preoxygenation: Pre-oxygenation with 100% oxygen Intubation Type: IV induction Ventilation: Mask ventilation without difficulty LMA Size: 3.0 Tube type: Oral Number of attempts: 1 Placement Confirmation: breath sounds checked- equal and bilateral and positive ETCO2 Tube secured with: Tape Dental Injury: Teeth and Oropharynx as per pre-operative assessment

## 2010-12-30 NOTE — Transfer of Care (Signed)
Immediate Anesthesia Transfer of Care Note  Patient: Dana Holder  Procedure(s) Performed:  DILATATION & CURETTAGE/HYSTEROSCOPY WITH THERMACHOICE ABLATION - in and out, Total Therapy Time: 14:00.   Temperature - 87  Patient Location: PACU  Anesthesia Type: General  Level of Consciousness: awake, alert , oriented and patient cooperative  Airway & Oxygen Therapy: Patient Spontanous Breathing and Patient connected to face mask oxygen  Post-op Assessment: Report given to PACU RN, Post -op Vital signs reviewed and stable and Patient moving all extremities  Post vital signs: Reviewed and stable  Complications: No apparent anesthesia complications

## 2010-12-30 NOTE — H&P (Signed)
Dana Holder is an 55 y.o. female. With post menopausal bleeding and thickened endometrial stripe.  Office endometrial biopsy reveals benign endometrial polyp.  For removal and ablation  Past Medical History  Diagnosis Date  . Gout     Past Surgical History  Procedure Date  . Tubal ligation   . Back surgery     lumbar disc    Family History  Problem Relation Age of Onset  . Anesthesia problems Neg Hx   . Hypotension Neg Hx   . Malignant hyperthermia Neg Hx   . Pseudochol deficiency Neg Hx     Social History:  reports that she quit smoking about 8 years ago. Her smoking use included Cigarettes. She has a 15 pack-year smoking history. She does not have any smokeless tobacco history on file. She reports that she does not drink alcohol or use illicit drugs.  Allergies: No Known Allergies  Prescriptions prior to admission  Medication Sig Dispense Refill  . ibuprofen (ADVIL,MOTRIN) 200 MG tablet Take 400 mg by mouth every 6 (six) hours as needed. For pain       . megestrol (MEGACE) 40 MG tablet Take 40 mg by mouth daily.          ROS  Review of Systems  Constitutional: Negative for fever, chills, weight loss, malaise/fatigue and diaphoresis.  HENT: Negative for hearing loss, ear pain, nosebleeds, congestion, sore throat, neck pain, tinnitus and ear discharge.   Eyes: Negative for blurred vision, double vision, photophobia, pain, discharge and redness.  Respiratory: Negative for cough, hemoptysis, sputum production, shortness of breath, wheezing and stridor.   Cardiovascular: Negative for chest pain, palpitations, orthopnea, claudication, leg swelling and PND.  Gastrointestinal:Negative for abdominal pain. Negative for heartburn, nausea, vomiting, diarrhea, constipation, blood in stool and melena.  Genitourinary: Negative for dysuria, urgency, frequency, hematuria and flank pain.  Musculoskeletal: Negative for myalgias, back pain, joint pain and falls.  Skin: Negative for  itching and rash.  Neurological: Negative for dizziness, tingling, tremors, sensory change, speech change, focal weakness, seizures, loss of consciousness, weakness and headaches.  Endo/Heme/Allergies: Negative for environmental allergies and polydipsia. Does not bruise/bleed easily.  Psychiatric/Behavioral: Negative for depression, suicidal ideas, hallucinations, memory loss and substance abuse. The patient is not nervous/anxious and does not have insomnia.      Blood pressure 121/75, pulse 76, temperature 98.7 F (37.1 C), temperature source Oral, resp. rate 15, height 5\' 6"  (1.676 m), weight 250 lb (113.399 kg). Physical Exam Physical Exam  Vitals reviewed. Constitutional: She is oriented to person, place, and time. She appears well-developed and well-nourished.  HENT:  Head: Normocephalic and atraumatic.  Right Ear: External ear normal.  Left Ear: External ear normal.  Nose: Nose normal.  Mouth/Throat: Oropharynx is clear and moist.  Eyes: Conjunctivae and EOM are normal. Pupils are equal, round, and reactive to light. Right eye exhibits no discharge. Left eye exhibits no discharge. No scleral icterus.  Neck: Normal range of motion. Neck supple. No tracheal deviation present. No thyromegaly present.  Cardiovascular: Normal rate, regular rhythm, normal heart sounds and intact distal pulses.  Exam reveals no gallop and no friction rub.   No murmur heard. Respiratory: Effort normal and breath sounds normal. No respiratory distress. She has no wheezes. She has no rales. She exhibits no tenderness.  GI: Soft. Bowel sounds are normal. She exhibits no distension and no mass. There is no tenderness. There is no rebound and no guarding.  Genitourinary:       Vulva is  normal without lesions Vagina is pink moist without discharge Cervix normal in appearance and pap is normal Uterus is normal sized with thickened stripe pathology endometrial polyp benign Adnexa is negative with normal sized  ovaries by sonogram  Musculoskeletal: Normal range of motion. She exhibits no edema and no tenderness.  Neurological: She is alert and oriented to person, place, and time. She has normal reflexes. She displays normal reflexes. No cranial nerve deficit. She exhibits normal muscle tone. Coordination normal.  Skin: Skin is warm and dry. No rash noted. No erythema. No pallor.  Psychiatric: She has a normal mood and affect. Her behavior is normal. Judgment and thought content normal.   Recent Results (from the past 336 hour(s))  SURGICAL PCR SCREEN   Collection Time   12/22/10  8:00 AM      Component Value Range   MRSA, PCR NEGATIVE  NEGATIVE    Staphylococcus aureus NEGATIVE  NEGATIVE   URINALYSIS, ROUTINE W REFLEX MICROSCOPIC   Collection Time   12/22/10  8:09 AM      Component Value Range   Color, Urine YELLOW  YELLOW    Appearance CLEAR  CLEAR    Specific Gravity, Urine 1.020  1.005 - 1.030    pH 6.0  5.0 - 8.0    Glucose, UA NEGATIVE  NEGATIVE (mg/dL)   Hgb urine dipstick LARGE (*) NEGATIVE    Bilirubin Urine NEGATIVE  NEGATIVE    Ketones, ur NEGATIVE  NEGATIVE (mg/dL)   Protein, ur NEGATIVE  NEGATIVE (mg/dL)   Urobilinogen, UA 0.2  0.0 - 1.0 (mg/dL)   Nitrite NEGATIVE  NEGATIVE    Leukocytes, UA TRACE (*) NEGATIVE   URINE MICROSCOPIC-ADD ON   Collection Time   12/22/10  8:09 AM      Component Value Range   Squamous Epithelial / LPF FEW (*) RARE    WBC, UA 0-2  <3 (WBC/hpf)   RBC / HPF 7-10  <3 (RBC/hpf)   Bacteria, UA FEW (*) RARE   CBC   Collection Time   12/22/10  8:30 AM      Component Value Range   WBC 8.1  4.0 - 10.5 (K/uL)   RBC 4.22  3.87 - 5.11 (MIL/uL)   Hemoglobin 12.5  12.0 - 15.0 (g/dL)   HCT 16.1  09.6 - 04.5 (%)   MCV 89.8  78.0 - 100.0 (fL)   MCH 29.6  26.0 - 34.0 (pg)   MCHC 33.0  30.0 - 36.0 (g/dL)   RDW 40.9  81.1 - 91.4 (%)   Platelets 250  150 - 400 (K/uL)  COMPREHENSIVE METABOLIC PANEL   Collection Time   12/22/10  8:30 AM      Component  Value Range   Sodium 141  135 - 145 (mEq/L)   Potassium 4.4  3.5 - 5.1 (mEq/L)   Chloride 106  96 - 112 (mEq/L)   CO2 24  19 - 32 (mEq/L)   Glucose, Bld 105 (*) 70 - 99 (mg/dL)   BUN 13  6 - 23 (mg/dL)   Creatinine, Ser 7.82  0.50 - 1.10 (mg/dL)   Calcium 95.6  8.4 - 10.5 (mg/dL)   Total Protein 6.8  6.0 - 8.3 (g/dL)   Albumin 3.9  3.5 - 5.2 (g/dL)   AST 11  0 - 37 (U/L)   ALT 9  0 - 35 (U/L)   Alkaline Phosphatase 70  39 - 117 (U/L)   Total Bilirubin 0.3  0.3 - 1.2 (mg/dL)  GFR calc non Af Amer 65 (*) >90 (mL/min)   GFR calc Af Amer 75 (*) >90 (mL/min)  HCG, QUANTITATIVE, PREGNANCY   Collection Time   12/22/10  8:30 AM      Component Value Range   hCG, Beta Chain, Quant, S <1  <5 (mIU/mL)         Assessment/Plan: Endometrial polyps Post menopausal bleeding  Hysteroscopy uterine currettage, endometrial ablation.  EURE,LUTHER H 12/30/2010, 10:46 AM

## 2010-12-30 NOTE — Anesthesia Postprocedure Evaluation (Signed)
  Anesthesia Post-op Note  Patient: Dana Holder  Procedure(s) Performed:  DILATATION & CURETTAGE/HYSTEROSCOPY WITH THERMACHOICE ABLATION - in and out, Total Therapy Time: 14:00.   Temperature - 87  Patient Location: PACU  Anesthesia Type: General  Level of Consciousness: awake, alert , oriented and patient cooperative  Airway and Oxygen Therapy: Patient Spontanous Breathing  Post-op Pain: none  Post-op Assessment: Post-op Vital signs reviewed, Patient's Cardiovascular Status Stable, Respiratory Function Stable, Patent Airway and No signs of Nausea or vomiting  Post-op Vital Signs: Reviewed and stable  Complications: No apparent anesthesia complications

## 2010-12-30 NOTE — Anesthesia Preprocedure Evaluation (Signed)
Anesthesia Evaluation  Patient identified by MRN, date of birth, ID band Patient awake    Reviewed: Allergy & Precautions, H&P , NPO status , Patient's Chart, lab work & pertinent test results  History of Anesthesia Complications Negative for: history of anesthetic complications  Airway Mallampati: II      Dental  (+) Teeth Intact   Pulmonary neg pulmonary ROS,    Pulmonary exam normal       Cardiovascular neg cardio ROS Regular Normal    Neuro/Psych    GI/Hepatic   Endo/Other    Renal/GU      Musculoskeletal   Abdominal   Peds  Hematology   Anesthesia Other Findings   Reproductive/Obstetrics                           Anesthesia Physical Anesthesia Plan  ASA: II  Anesthesia Plan: General   Post-op Pain Management:    Induction: Intravenous  Airway Management Planned: LMA  Additional Equipment:   Intra-op Plan:   Post-operative Plan: Extubation in OR  Informed Consent: I have reviewed the patients History and Physical, chart, labs and discussed the procedure including the risks, benefits and alternatives for the proposed anesthesia with the patient or authorized representative who has indicated his/her understanding and acceptance.     Plan Discussed with:   Anesthesia Plan Comments:         Anesthesia Quick Evaluation

## 2010-12-31 NOTE — Progress Notes (Signed)
LATE ENTRY  12/30/10  1401  Fentanyl 50 mcg given iv per Larrie Kass RN on arrival to Phase 2 per PACU standing order prior to release to Phase 2 RN. Pain level 5.

## 2010-12-31 NOTE — Progress Notes (Signed)
LATE ENTRY  12/30/10  1410  Pain level 2.

## 2013-05-03 ENCOUNTER — Other Ambulatory Visit (HOSPITAL_COMMUNITY): Payer: Self-pay | Admitting: Internal Medicine

## 2013-05-03 DIAGNOSIS — Z0000 Encounter for general adult medical examination without abnormal findings: Secondary | ICD-10-CM

## 2013-05-07 ENCOUNTER — Ambulatory Visit (HOSPITAL_COMMUNITY)
Admission: RE | Admit: 2013-05-07 | Discharge: 2013-05-07 | Disposition: A | Discharge status: Home / Self Care | Payer: BC Managed Care – PPO | Source: Ambulatory Visit | Attending: Internal Medicine | Admitting: Internal Medicine

## 2013-05-07 DIAGNOSIS — Z1231 Encounter for screening mammogram for malignant neoplasm of breast: Secondary | ICD-10-CM | POA: Insufficient documentation

## 2013-05-07 DIAGNOSIS — Z0000 Encounter for general adult medical examination without abnormal findings: Secondary | ICD-10-CM

## 2013-08-14 ENCOUNTER — Ambulatory Visit (HOSPITAL_COMMUNITY)
Admission: RE | Admit: 2013-08-14 | Discharge: 2013-08-14 | Disposition: A | Discharge status: Home / Self Care | Payer: BC Managed Care – PPO | Source: Ambulatory Visit | Attending: Internal Medicine | Admitting: Internal Medicine

## 2013-08-14 ENCOUNTER — Other Ambulatory Visit (HOSPITAL_COMMUNITY): Payer: Self-pay | Admitting: Internal Medicine

## 2013-08-14 DIAGNOSIS — M79672 Pain in left foot: Secondary | ICD-10-CM

## 2013-08-14 DIAGNOSIS — M79609 Pain in unspecified limb: Secondary | ICD-10-CM | POA: Insufficient documentation

## 2013-08-14 DIAGNOSIS — M7730 Calcaneal spur, unspecified foot: Secondary | ICD-10-CM | POA: Insufficient documentation

## 2015-10-17 ENCOUNTER — Encounter (INDEPENDENT_AMBULATORY_CARE_PROVIDER_SITE_OTHER): Payer: Self-pay | Admitting: *Deleted

## 2015-10-21 ENCOUNTER — Telehealth: Payer: Self-pay

## 2015-10-21 NOTE — Telephone Encounter (Signed)
Pt received a triage letter from [facility]. Please call [phone]

## 2015-10-22 NOTE — Telephone Encounter (Signed)
I called pt and she has constipation. OV with Neil Crouch, PA on 11/17/2015 at 8:00 Am.

## 2015-10-30 ENCOUNTER — Telehealth: Payer: Self-pay

## 2015-10-30 NOTE — Telephone Encounter (Signed)
Pt is aware that our doctors should be able to do her colonoscopy before the end of the year.  She will keep the OV appt as scheduled.

## 2015-10-30 NOTE — Telephone Encounter (Signed)
Pt called for [facility]. Pt is scheduled for 10/16 for an OV prior to scheduling colonoscopy. She said that she received a letter from the "referral people" saying it would be December or January before she could have her colonoscopy done and didn't know if she still needed this appointment. Please call [phone]

## 2015-11-17 ENCOUNTER — Other Ambulatory Visit: Payer: Self-pay

## 2015-11-17 ENCOUNTER — Encounter: Payer: Self-pay | Admitting: Gastroenterology

## 2015-11-17 ENCOUNTER — Ambulatory Visit (INDEPENDENT_AMBULATORY_CARE_PROVIDER_SITE_OTHER): Payer: Commercial Managed Care - PPO | Admitting: Gastroenterology

## 2015-11-17 DIAGNOSIS — K5900 Constipation, unspecified: Secondary | ICD-10-CM | POA: Diagnosis not present

## 2015-11-17 DIAGNOSIS — Z1211 Encounter for screening for malignant neoplasm of colon: Secondary | ICD-10-CM

## 2015-11-17 MED ORDER — PEG 3350-KCL-NA BICARB-NACL 420 G PO SOLR
4000.0000 mL | ORAL | 0 refills | Status: DC
Start: 2015-11-17 — End: 2015-12-02

## 2015-11-17 NOTE — Progress Notes (Addendum)
REVIEWED-NO ADDITIONAL RECOMMENDATIONS.  Primary Care Physician:  Glo Herring., MD  Primary Gastroenterologist:  Barney Drain, MD   Chief Complaint  Patient presents with  . Colonoscopy    HPI:  Dana Holder is a 60 y.o. female here To schedule first ever colonoscopy. Family history significant for multiple malignancies including lung cancer, ovarian cancer in her siblings. Father had throat cancer. Son had AML. No colon cancer Turner knowledge.  Episodic constipation. May go few days without BM and then small stool. Associated abdominal discomfort. No melena, brbpr. No heartburn, dysphagia. No vomiting. No unintentional weight loss. Has not tried anything for constipation.  Current Outpatient Prescriptions  Medication Sig Dispense Refill  . ibuprofen (ADVIL,MOTRIN) 200 MG tablet Take 400 mg by mouth every 6 (six) hours as needed (rare). For pain     . levothyroxine (SYNTHROID, LEVOTHROID) 75 MCG tablet     . temazepam (RESTORIL) 15 MG capsule      No current facility-administered medications for this visit.     Allergies as of 11/17/2015  . (No Known Allergies)    Past Medical History:  Diagnosis Date  . Gout   . Hypothyroidism     Past Surgical History:  Procedure Laterality Date  . BACK SURGERY  2002   lumbar disc  . TUBAL LIGATION      Family History  Problem Relation Age of Onset  . Acute myelogenous leukemia Son   . Throat cancer Father   . Ovarian cancer Sister   . Lung cancer Brother   . Anesthesia problems Neg Hx   . Hypotension Neg Hx   . Malignant hyperthermia Neg Hx   . Pseudochol deficiency Neg Hx   . Colon cancer Neg Hx     Social History   Social History  . Marital status: Married    Spouse name: N/A  . Number of children: N/A  . Years of education: N/A   Occupational History  . Not on file.   Social History Main Topics  . Smoking status: Former Smoker    Packs/day: 0.50    Years: 30.00    Types: Cigarettes    Quit  date: 06/21/2002  . Smokeless tobacco: Never Used  . Alcohol use No  . Drug use: No  . Sexual activity: Yes    Birth control/ protection: Post-menopausal   Other Topics Concern  . Not on file   Social History Narrative  . No narrative on file      ROS:  General: Negative for anorexia, weight loss, fever, chills, fatigue, weakness. Eyes: Negative for vision changes.  ENT: Negative for hoarseness, difficulty swallowing , nasal congestion. CV: Negative for chest pain, angina, palpitations, dyspnea on exertion, peripheral edema.  Respiratory: Negative for dyspnea at rest, dyspnea on exertion, cough, sputum, wheezing.  GI: See history of present illness. GU:  Negative for dysuria, hematuria, urinary incontinence, urinary frequency, nocturnal urination.  MS: Negative for joint pain, low back pain.  Derm: Negative for rash or itching.  Neuro: Negative for weakness, abnormal sensation, seizure, frequent headaches, memory loss, confusion.  Psych: Negative for anxiety, suicidal ideation, hallucinations. Positive depression, son passed away earlier this year with AML. Endo: Negative for unusual weight change.  Heme: Negative for bruising or bleeding. Allergy: Negative for rash or hives.    Physical Examination:  BP 120/78   Pulse 73   Temp 97.9 F (36.6 C) (Oral)   Ht 5\' 6"  (1.676 m)   Wt 244 lb 6.4 oz (110.9 kg)  BMI 39.45 kg/m    General: Well-nourished, well-developed in no acute distress.  Head: Normocephalic, atraumatic.   Eyes: Conjunctiva pink, no icterus. Mouth: Oropharyngeal mucosa moist and pink , no lesions erythema or exudate. Neck: Supple without thyromegaly, masses, or lymphadenopathy.  Lungs: Clear to auscultation bilaterally.  Heart: Regular rate and rhythm, no murmurs rubs or gallops.  Abdomen: Bowel sounds are normal, nontender, nondistended, no hepatosplenomegaly or masses, no abdominal bruits or    hernia , no rebound or guarding.   Rectal:  Deferred Extremities: No lower extremity edema. No clubbing or deformities.  Neuro: Alert and oriented x 4 , grossly normal neurologically.  Skin: Warm and dry, no rash or jaundice.   Psych: Alert and cooperative, normal mood and affect.  Labs: Labs from August 2017 White blood cell count 7700, hemoglobin 13.5, hematocrit 39.6, MCV 87, platelets 222,000, BUN 15, creatinine 0.99, total bilirubin 0.4, alkaline phosphatase 77, AST 13, ALT 12, albumin 4.5, TSH 2.650  Imaging Studies: No results found.

## 2015-11-17 NOTE — Assessment & Plan Note (Addendum)
Increase dietary fiber, water consumption. Try to walk 30 minutes at least 5 days per week. Linzess 129mcg daily. Colonoscopy in near future.  I have discussed the risks, alternatives, benefits with regards to but not limited to the risk of reaction to medication, bleeding, infection, perforation and the patient is agreeable to proceed. Written consent to be obtained.

## 2015-11-17 NOTE — Patient Instructions (Addendum)
1. Colonoscopy as scheduled. Please see separate instructions. 2. Linzess 154mcg daily on empty stomach for constipation. Samples provided. Save at least four days worth to take leading up to your bowel prep. If you want a prescription please call.

## 2015-11-17 NOTE — Progress Notes (Signed)
cc'ed to pcp °

## 2015-12-08 ENCOUNTER — Encounter (HOSPITAL_COMMUNITY)
Admission: RE | Disposition: A | Discharge status: Home / Self Care | Payer: Self-pay | Source: Ambulatory Visit | Attending: Gastroenterology

## 2015-12-08 ENCOUNTER — Encounter (HOSPITAL_COMMUNITY): Payer: Self-pay | Admitting: *Deleted

## 2015-12-08 ENCOUNTER — Ambulatory Visit (HOSPITAL_COMMUNITY)
Admission: RE | Admit: 2015-12-08 | Discharge: 2015-12-08 | Disposition: A | Discharge status: Home / Self Care | Payer: Commercial Managed Care - PPO | Source: Ambulatory Visit | Attending: Gastroenterology | Admitting: Gastroenterology

## 2015-12-08 DIAGNOSIS — Z1212 Encounter for screening for malignant neoplasm of rectum: Secondary | ICD-10-CM

## 2015-12-08 DIAGNOSIS — D123 Benign neoplasm of transverse colon: Secondary | ICD-10-CM | POA: Diagnosis not present

## 2015-12-08 DIAGNOSIS — Z79899 Other long term (current) drug therapy: Secondary | ICD-10-CM | POA: Insufficient documentation

## 2015-12-08 DIAGNOSIS — E039 Hypothyroidism, unspecified: Secondary | ICD-10-CM | POA: Insufficient documentation

## 2015-12-08 DIAGNOSIS — Z87891 Personal history of nicotine dependence: Secondary | ICD-10-CM | POA: Diagnosis not present

## 2015-12-08 DIAGNOSIS — K635 Polyp of colon: Secondary | ICD-10-CM | POA: Diagnosis not present

## 2015-12-08 DIAGNOSIS — K5730 Diverticulosis of large intestine without perforation or abscess without bleeding: Secondary | ICD-10-CM | POA: Insufficient documentation

## 2015-12-08 DIAGNOSIS — K648 Other hemorrhoids: Secondary | ICD-10-CM | POA: Insufficient documentation

## 2015-12-08 DIAGNOSIS — Z1211 Encounter for screening for malignant neoplasm of colon: Secondary | ICD-10-CM | POA: Insufficient documentation

## 2015-12-08 DIAGNOSIS — Q438 Other specified congenital malformations of intestine: Secondary | ICD-10-CM | POA: Insufficient documentation

## 2015-12-08 HISTORY — PX: COLONOSCOPY: SHX5424

## 2015-12-08 SURGERY — COLONOSCOPY
Anesthesia: Moderate Sedation

## 2015-12-08 MED ORDER — MEPERIDINE HCL 100 MG/ML IJ SOLN
INTRAMUSCULAR | Status: DC | PRN
  Administered 2015-12-08 (×2): 25 mg via INTRAVENOUS

## 2015-12-08 MED ORDER — SODIUM CHLORIDE 0.9 % IV SOLN
INTRAVENOUS | Status: DC
  Administered 2015-12-08: 1000 mL via INTRAVENOUS

## 2015-12-08 MED ORDER — MEPERIDINE HCL 100 MG/ML IJ SOLN
INTRAMUSCULAR | Status: AC
  Filled 2015-12-08: qty 1

## 2015-12-08 MED ORDER — MEPERIDINE HCL 100 MG/ML IJ SOLN
INTRAMUSCULAR | Status: AC
  Filled 2015-12-08: qty 2

## 2015-12-08 MED ORDER — MIDAZOLAM HCL 5 MG/5ML IJ SOLN
INTRAMUSCULAR | Status: DC | PRN
  Administered 2015-12-08: 2 mg via INTRAVENOUS
  Administered 2015-12-08: 1 mg via INTRAVENOUS
  Administered 2015-12-08: 2 mg via INTRAVENOUS

## 2015-12-08 MED ORDER — STERILE WATER FOR IRRIGATION IR SOLN
Status: DC | PRN
  Administered 2015-12-08: 100 mL

## 2015-12-08 MED ORDER — MIDAZOLAM HCL 5 MG/5ML IJ SOLN
INTRAMUSCULAR | Status: AC
  Filled 2015-12-08: qty 10

## 2015-12-08 NOTE — Op Note (Signed)
[facility] Patient Name: Dana Holder Procedure Date: 12/08/2015 9:37 AM MRN: [id number] Date of Birth: 1955/03/09 Attending MD: Barney Drain , MD CSN: [id number] Age: 60 Admit Type: Outpatient Procedure:                Colonoscopy WITH COLD FORCEPS/SNARE POLYPECTOMY Indications:              Screening for colorectal malignant neoplasm Providers:                Barney Drain, MD, Lurline Del, RN, Isabella Stalling,                            Technician Referring MD:             Redmond School, MD Medicines:                Meperidine 50 mg IV, Midazolam 5 mg IV Complications:            No immediate complications. Estimated Blood Loss:     Estimated blood loss was minimal. Procedure:                Pre-Anesthesia Assessment:                           - Prior to the procedure, a History and Physical                            was performed, and patient medications and                            allergies were reviewed. The patient's tolerance of                            previous anesthesia was also reviewed. The risks                            and benefits of the procedure and the sedation                            options and risks were discussed with the patient.                            All questions were answered, and informed consent                            was obtained. Prior Anticoagulants: The patient has                            taken no previous anticoagulant or antiplatelet                            agents. ASA Grade Assessment: I - A normal, healthy                            patient. After reviewing the risks and benefits,  the patient was deemed in satisfactory condition to                            undergo the procedure. After obtaining informed                            consent, the colonoscope was passed under direct                            vision. Throughout the procedure, the patient's                            blood  pressure, pulse, and oxygen saturations were                            monitored continuously. The [id number]                            scope was introduced through the anus and advanced                            to the the cecum, identified by appendiceal orifice                            and ileocecal valve. The ileocecal valve,                            appendiceal orifice, and rectum were photographed.                            The colonoscopy was somewhat difficult due to a                            tortuous colon. Successful completion of the                            procedure was aided by using manual pressure,                            straightening and shortening the scope to obtain                            bowel loop reduction and COLOWRAP. The patient                            tolerated the procedure fairly well. The quality of                            the bowel preparation was excellent. Scope In: 9:57:08 AM Scope Out: 10:19:26 AM Scope Withdrawal Time: 0 hours 15 minutes 35 seconds  Total Procedure Duration: 0 hours 22 minutes 18 seconds  Findings:      A 6 mm polyp was found in the hepatic flexure. The polyp was sessile.  The polyp was removed with a hot snare. Resection and retrieval were       complete.      A 3 mm polyp was found in the sigmoid colon. The polyp was sessile. The       polyp was removed with a cold biopsy forceps. Resection and retrieval       were complete.      Multiple small and large-mouthed diverticula were found in the sigmoid       colon, descending colon and mid transverse colon.      Non-bleeding internal hemorrhoids were found. The hemorrhoids were small.      The recto-sigmoid colon and sigmoid colon were moderately redundant. Impression:               - One 6 mm polyp at the hepatic flexure, removed                            with a hot snare. Resected and retrieved.                           - One 3 mm polyp in  the sigmoid colon, removed with                            a cold biopsy forceps. Resected and retrieved.                           - MODERATE Diverticulosis in the sigmoid colon, in                            the descending colon and in the mid transverse                            colon.                           - SMALL Non-bleeding internal hemorrhoids.                           - Redundant LEFT colon. Moderate Sedation:      Moderate (conscious) sedation was administered by the endoscopy nurse       and supervised by the endoscopist. The following parameters were       monitored: oxygen saturation, heart rate, blood pressure, and response       to care. Total physician intraservice time was 34 minutes. Recommendation:           - High fiber diet.                           - Continue present medications.                           - Await pathology results.                           - Repeat colonoscopy in 5-10 years for surveillance.                           -  Patient has a contact number available for                            emergencies. The signs and symptoms of potential                            delayed complications were discussed with the                            patient. Return to normal activities tomorrow.                            Written discharge instructions were provided to the                            patient. Procedure Code(s):        --- Professional ---                           [phone], Colonoscopy, flexible; with removal of                            tumor(s), polyp(s), or other lesion(s) by snare                            technique                           45380, 59, Colonoscopy, flexible; with biopsy,                            single or multiple                           99152, Moderate sedation services provided by the                            same physician or other qualified health care                            professional performing the  diagnostic or                            therapeutic service that the sedation supports,                            requiring the presence of an independent trained                            observer to assist in the monitoring of the                            patient's level of consciousness and physiological                            status;  initial 15 minutes of intraservice time,                            patient age 78 years or older                           [phone], Moderate sedation services; each additional                            15 minutes intraservice time Diagnosis Code(s):        --- Professional ---                           Z12.11, Encounter for screening for malignant                            neoplasm of colon                           D12.3, Benign neoplasm of transverse colon (hepatic                            flexure or splenic flexure)                           D12.5, Benign neoplasm of sigmoid colon                           K64.8, Other hemorrhoids                           K57.30, Diverticulosis of large intestine without                            perforation or abscess without bleeding                           Q43.8, Other specified congenital malformations of                            intestine CPT copyright 2016 [employer]. All rights reserved. The codes documented in this report are preliminary and upon coder review may  be revised to meet current compliance requirements. Barney Drain, MD Barney Drain, MD 12/08/2015 10:32:08 AM This report has been signed electronically. Number of Addenda: 0

## 2015-12-08 NOTE — Discharge Instructions (Signed)
You have SMALL linternal hemorrhoids. YOU HAVE diverticulosis IN YOUR LEFT AND RIGHT COLON. YOU HAD TWO SMALL POLYPS REMOVED.   CONTINUE YOUR WEIGHT LOSS EFFORTS. YOUR BODY MASS INDEX IS OVER 30 WHICH MEANS YOU ARE OBESE. OBESITY IS ASSOCIATED WITH AN INCREASE FOR ALL CANCERS, INCLUDING BREAST, ESOPHAGEAL, AND COLON CANCER.  DRINK WATER TO KEEP YOUR URINE LIGHT YELLOW.  FOLLOW A HIGH FIBER DIET. AVOID ITEMS THAT CAUSE BLOATING. See info below.  CONTINUE YOUR WEIGHT LOSS EFFORTS. LOSE TEN POUNDS.  USE PREPARATION H FOUR TIMES  A DAY IF NEEDED TO RELIEVE RECTAL PAIN/PRESSURE/BLEEDING.   YOUR BIOPSY RESULTS WILL BE AVAILABLE IN MY CHART  NOV 8 AND MY OFFICE WILL CONTACT YOU IN 10-14 DAYS WITH YOUR RESULTS.   Next colonoscopy in 5-10 years.  Colonoscopy Care After Read the instructions outlined below and refer to this sheet in the next week. These discharge instructions provide you with general information on caring for yourself after you leave the hospital. While your treatment has been planned according to the most current medical practices available, unavoidable complications occasionally occur. If you have any problems or questions after discharge, call DR. FIELDS, [phone].  ACTIVITY  You may resume your regular activity, but move at a slower pace for the next 24 hours.   Take frequent rest periods for the next 24 hours.   Walking will help get rid of the air and reduce the bloated feeling in your belly (abdomen).   No driving for 24 hours (because of the medicine (anesthesia) used during the test).   You may shower.   Do not sign any important legal documents or operate any machinery for 24 hours (because of the anesthesia used during the test).    NUTRITION  Drink plenty of fluids.   You may resume your normal diet as instructed by your doctor.   Begin with a light meal and progress to your normal diet. Heavy or fried foods are harder to digest and may make you feel  sick to your stomach (nauseated).   Avoid alcoholic beverages for 24 hours or as instructed.    MEDICATIONS  You may resume your normal medications.   WHAT YOU CAN EXPECT TODAY  Some feelings of bloating in the abdomen.   Passage of more gas than usual.   Spotting of blood in your stool or on the toilet paper  .  IF YOU HAD POLYPS REMOVED DURING THE COLONOSCOPY:  Eat a soft diet IF YOU HAVE NAUSEA, BLOATING, ABDOMINAL PAIN, OR VOMITING.    FINDING OUT THE RESULTS OF YOUR TEST Not all test results are available during your visit. DR. Oneida Alar WILL CALL YOU WITHIN 14 DAYS OF YOUR PROCEDUE WITH YOUR RESULTS. Do not assume everything is normal if you have not heard from DR. FIELDS, CALL HER OFFICE AT [phone].  SEEK IMMEDIATE MEDICAL ATTENTION AND CALL THE OFFICE: [phone] IF:  You have more than a spotting of blood in your stool.   Your belly is swollen (abdominal distention).   You are nauseated or vomiting.   You have a temperature over 101F.   You have abdominal pain or discomfort that is severe or gets worse throughout the day.  High-Fiber Diet A high-fiber diet changes your normal diet to include more whole grains, legumes, fruits, and vegetables. Changes in the diet involve replacing refined carbohydrates with unrefined foods. The calorie level of the diet is essentially unchanged. The Dietary Reference Intake (recommended amount) for adult males is 38 grams per day.  For adult females, it is 25 grams per day. Pregnant and lactating women should consume 28 grams of fiber per day. Fiber is the intact part of a plant that is not broken down during digestion. Functional fiber is fiber that has been isolated from the plant to provide a beneficial effect in the body. PURPOSE  Increase stool bulk.   Ease and regulate bowel movements.   Lower cholesterol.  REDUCE RISK OF COLON CANCER  INDICATIONS THAT YOU NEED MORE FIBER  Constipation and hemorrhoids.    Uncomplicated diverticulosis (intestine condition) and irritable bowel syndrome.   Weight management.   As a protective measure against hardening of the arteries (atherosclerosis), diabetes, and cancer.   GUIDELINES FOR INCREASING FIBER IN THE DIET  Start adding fiber to the diet slowly. A gradual increase of about 5 more grams (2 slices of whole-wheat bread, 2 servings of most fruits or vegetables, or 1 bowl of high-fiber cereal) per day is best. Too rapid an increase in fiber may result in constipation, flatulence, and bloating.   Drink enough water and fluids to keep your urine clear or pale yellow. Water, juice, or caffeine-free drinks are recommended. Not drinking enough fluid may cause constipation.   Eat a variety of high-fiber foods rather than one type of fiber.   Try to increase your intake of fiber through using high-fiber foods rather than fiber pills or supplements that contain small amounts of fiber.   The goal is to change the types of food eaten. Do not supplement your present diet with high-fiber foods, but replace foods in your present diet.   INCLUDE A VARIETY OF FIBER SOURCES  Replace refined and processed grains with whole grains, canned fruits with fresh fruits, and incorporate other fiber sources. White rice, white breads, and most bakery goods contain little or no fiber.   Brown whole-grain rice, buckwheat oats, and many fruits and vegetables are all good sources of fiber. These include: broccoli, Brussels sprouts, cabbage, cauliflower, beets, sweet potatoes, white potatoes (skin on), carrots, tomatoes, eggplant, squash, berries, fresh fruits, and dried fruits.   Cereals appear to be the richest source of fiber. Cereal fiber is found in whole grains and bran. Bran is the fiber-rich outer coat of cereal grain, which is largely removed in refining. In whole-grain cereals, the bran remains. In breakfast cereals, the largest amount of fiber is found in those with  "bran" in their names. The fiber content is sometimes indicated on the label.   You may need to include additional fruits and vegetables each day.   In baking, for 1 cup white flour, you may use the following substitutions:   1 cup whole-wheat flour minus 2 tablespoons.   1/2 cup white flour plus 1/2 cup whole-wheat flour.   Polyps, Colon  A polyp is extra tissue that grows inside your body. Colon polyps grow in the large intestine. The large intestine, also called the colon, is part of your digestive system. It is a long, hollow tube at the end of your digestive tract where your body makes and stores stool. Most polyps are not dangerous. They are benign. This means they are not cancerous. But over time, some types of polyps can turn into cancer. Polyps that are smaller than a pea are usually not harmful. But larger polyps could someday become or may already be cancerous. To be safe, doctors remove all polyps and test them.   PREVENTION There is not one sure way to prevent polyps. You might be able  to lower your risk of getting them if you:  Eat more fruits and vegetables and less fatty food.   Do not smoke.   Avoid alcohol.   Exercise every day.   Lose weight if you are overweight.   Eating more calcium and folate can also lower your risk of getting polyps. Some foods that are rich in calcium are milk, cheese, and broccoli. Some foods that are rich in folate are chickpeas, kidney beans, and spinach.    Diverticulosis Diverticulosis is a common condition that develops when small pouches (diverticula) form in the wall of the colon. The risk of diverticulosis increases with age. It happens more often in people who eat a low-fiber diet. Most individuals with diverticulosis have no symptoms. Those individuals with symptoms usually experience belly (abdominal) pain, constipation, or loose stools (diarrhea).  HOME CARE INSTRUCTIONS  Increase the amount of fiber in your diet as directed  by your caregiver or dietician. This may reduce symptoms of diverticulosis.   Drink at least 6 to 8 glasses of water each day to prevent constipation.   Try not to strain when you have a bowel movement.   Avoiding nuts and seeds to prevent complications is NOT NECESSARY.     FOODS HAVING HIGH FIBER CONTENT INCLUDE:  Fruits. Apple, peach, pear, tangerine, raisins, prunes.   Vegetables. Brussels sprouts, asparagus, broccoli, cabbage, carrot, cauliflower, romaine lettuce, spinach, summer squash, tomato, winter squash, zucchini.   Starchy Vegetables. Baked beans, kidney beans, lima beans, split peas, lentils, potatoes (with skin).   Grains. Whole wheat bread, brown rice, bran flake cereal, plain oatmeal, white rice, shredded wheat, bran muffins.    SEEK IMMEDIATE MEDICAL CARE IF:  You develop increasing pain or severe bloating.   You have an oral temperature above 101F.   You develop vomiting or bowel movements that are bloody or black.   Hemorrhoids Hemorrhoids are dilated (enlarged) veins around the rectum. Sometimes clots will form in the veins. This makes them swollen and painful. These are called thrombosed hemorrhoids. Causes of hemorrhoids include:  Constipation.   Straining to have a bowel movement.   HEAVY LIFTING  HOME CARE INSTRUCTIONS  Eat a well balanced diet and drink 6 to 8 glasses of water every day to avoid constipation. You may also use a bulk laxative.   Avoid straining to have bowel movements.   Keep anal area dry and clean.   Do not use a donut shaped pillow or sit on the toilet for long periods. This increases blood pooling and pain.   Move your bowels when your body has the urge; this will require less straining and will decrease pain and pressure.

## 2015-12-08 NOTE — H&P (Signed)
Primary Care Physician:  Glo Herring., MD Primary Gastroenterologist:  Dr. Oneida Alar  Pre-Procedure History & Physical: HPI:  Dana Holder is a 60 y.o. female here for [place].  Past Medical History:  Diagnosis Date  . Gout   . Hypothyroidism     Past Surgical History:  Procedure Laterality Date  . BACK SURGERY  2002   lumbar disc  . TUBAL LIGATION      Prior to Admission medications   Medication Sig Start Date End Date Taking? Authorizing Provider  levothyroxine (SYNTHROID, LEVOTHROID) 75 MCG tablet Take 75 mcg by mouth daily before breakfast.  10/24/15  Yes Historical Provider, MD  temazepam (RESTORIL) 15 MG capsule Take 15 mg by mouth at bedtime.  11/03/15  Yes Historical Provider, MD    Allergies as of 11/17/2015  . (No Known Allergies)    Family History  Problem Relation Age of Onset  . Acute myelogenous leukemia Son   . Throat cancer Father   . Ovarian cancer Sister   . Lung cancer Brother   . Anesthesia problems Neg Hx   . Hypotension Neg Hx   . Malignant hyperthermia Neg Hx   . Pseudochol deficiency Neg Hx   . Colon cancer Neg Hx     Social History   Social History  . Marital status: Married    Spouse name: N/A  . Number of children: N/A  . Years of education: N/A   Occupational History  . Not on file.   Social History Main Topics  . Smoking status: Former Smoker    Packs/day: 0.50    Years: 30.00    Types: Cigarettes    Quit date: 06/21/2002  . Smokeless tobacco: Never Used  . Alcohol use No  . Drug use: No  . Sexual activity: Yes    Birth control/ protection: Post-menopausal   Other Topics Concern  . Not on file   Social History Narrative  . No narrative on file    Review of Systems: See HPI, otherwise negative ROS   Physical Exam: BP 123/89   Pulse 67   Temp 98.5 F (36.9 C) (Oral)   SpO2 99%  General:   Alert,  pleasant and cooperative in NAD Head:  Normocephalic and atraumatic. Neck:  Supple; Lungs:   Clear throughout to auscultation.    Heart:  Regular rate and rhythm. Abdomen:  Soft, nontender and nondistended. Normal bowel sounds, without guarding, and without rebound.   Neurologic:  Alert and  oriented x4;  grossly normal neurologically.  Impression/Plan:     SCREENING  Plan:  1. TCS TODAY. DISCUSSED PROCEDURE, BENEFITS, & RISKS: < 1% chance of medication reaction, bleeding, perforation, or rupture of spleen/liver.

## 2015-12-12 ENCOUNTER — Encounter (HOSPITAL_COMMUNITY): Payer: Self-pay | Admitting: Gastroenterology

## 2015-12-24 ENCOUNTER — Telehealth: Payer: Self-pay | Admitting: Gastroenterology

## 2015-12-24 ENCOUNTER — Telehealth: Payer: Self-pay

## 2015-12-24 NOTE — Telephone Encounter (Deleted)
Pt was a no show and letter sent  °

## 2015-12-24 NOTE — Telephone Encounter (Signed)
Disregard last phone note,  Patient called inquiring about tcs results

## 2015-12-30 ENCOUNTER — Telehealth: Payer: Self-pay

## 2015-12-30 NOTE — Telephone Encounter (Signed)
Tried to call and VM not set up.  See lab results, Ginger had told pt on 12/11/2015.

## 2015-12-30 NOTE — Telephone Encounter (Signed)
Pt is aware of her results.  

## 2015-12-30 NOTE — Telephone Encounter (Signed)
Pt said that the nurse had tried calling her and was returning call. I told her that DS wasn't available, but I would let her know that she had called. [phone]

## 2016-01-29 ENCOUNTER — Telehealth: Payer: Self-pay | Admitting: Gastroenterology

## 2016-01-29 NOTE — Telephone Encounter (Signed)
Pt is aware of what SLF and she was still not happy

## 2016-01-29 NOTE — Telephone Encounter (Signed)
Pt called back and said that she wants to talk to SLF. I told her that she was not here and she said that SLF had to fix the code on the claim because she was not going to pay for the TCS.

## 2016-01-29 NOTE — Telephone Encounter (Signed)
Tried to call with no answer  

## 2016-01-29 NOTE — Telephone Encounter (Signed)
[phone]  CAN YOU PLEASE CALL PATIENT REGARDING HER TCS,  STATES THE CODE WAS NOT PUT IN THE MEDICAL RECORD CORRECT AND THEY ARE NOT GOING TO PAY

## 2016-01-29 NOTE — Telephone Encounter (Signed)
PLEASE CALL PT. The indication for her colonoscopy was screening. She had polyps removed. The codes that were entered for her colonoscopy were 45380 and [phone] which correspond to how the polyps were removed. Some insurance companies will only partiallycover the colonoscopy if polyps are removed. SHE SHOULD ASK HER INSURANCE COMPANY IF THAT IS WHY SHE IS BEING BILLED FOR THE SERVICES.

## 2016-01-29 NOTE — Telephone Encounter (Signed)
Pt stated that the insurance company told her that the diagnosis was not put on the clam. I gave her the billing number to call and see if they can help her.

## 2016-02-17 NOTE — Telephone Encounter (Signed)
I spoke with the patient and she was still very frustrated and didn't want to speak about her bill, because she has a lot of things going on in her personal life right now.

## 2016-03-03 DIAGNOSIS — Z1389 Encounter for screening for other disorder: Secondary | ICD-10-CM | POA: Diagnosis not present

## 2016-03-03 DIAGNOSIS — L658 Other specified nonscarring hair loss: Secondary | ICD-10-CM | POA: Diagnosis not present

## 2016-03-10 ENCOUNTER — Other Ambulatory Visit (HOSPITAL_COMMUNITY): Payer: Self-pay | Admitting: Internal Medicine

## 2016-03-10 DIAGNOSIS — Z1231 Encounter for screening mammogram for malignant neoplasm of breast: Secondary | ICD-10-CM

## 2016-03-15 ENCOUNTER — Ambulatory Visit (HOSPITAL_COMMUNITY)
Admission: RE | Admit: 2016-03-15 | Discharge: 2016-03-15 | Disposition: A | Discharge status: Home / Self Care | Payer: Commercial Managed Care - PPO | Source: Ambulatory Visit | Attending: Internal Medicine | Admitting: Internal Medicine

## 2016-03-15 DIAGNOSIS — Z1231 Encounter for screening mammogram for malignant neoplasm of breast: Secondary | ICD-10-CM | POA: Insufficient documentation

## 2016-05-07 ENCOUNTER — Other Ambulatory Visit (HOSPITAL_COMMUNITY): Payer: Self-pay | Admitting: Internal Medicine

## 2016-05-07 ENCOUNTER — Ambulatory Visit (HOSPITAL_COMMUNITY)
Admission: RE | Admit: 2016-05-07 | Discharge: 2016-05-07 | Disposition: A | Discharge status: Home / Self Care | Payer: Commercial Managed Care - PPO | Source: Ambulatory Visit | Attending: Internal Medicine | Admitting: Internal Medicine

## 2016-05-07 DIAGNOSIS — Z111 Encounter for screening for respiratory tuberculosis: Secondary | ICD-10-CM | POA: Diagnosis present

## 2016-05-16 DIAGNOSIS — Z79899 Other long term (current) drug therapy: Secondary | ICD-10-CM | POA: Diagnosis not present

## 2016-05-16 DIAGNOSIS — E039 Hypothyroidism, unspecified: Secondary | ICD-10-CM | POA: Diagnosis not present

## 2016-05-16 DIAGNOSIS — L309 Dermatitis, unspecified: Secondary | ICD-10-CM | POA: Diagnosis not present

## 2016-06-05 DIAGNOSIS — E039 Hypothyroidism, unspecified: Secondary | ICD-10-CM | POA: Diagnosis not present

## 2016-06-05 DIAGNOSIS — S39012A Strain of muscle, fascia and tendon of lower back, initial encounter: Secondary | ICD-10-CM | POA: Diagnosis not present

## 2016-06-05 DIAGNOSIS — X58XXXA Exposure to other specified factors, initial encounter: Secondary | ICD-10-CM | POA: Diagnosis not present

## 2016-06-05 DIAGNOSIS — Z79899 Other long term (current) drug therapy: Secondary | ICD-10-CM | POA: Diagnosis not present

## 2016-06-08 ENCOUNTER — Ambulatory Visit (HOSPITAL_COMMUNITY)
Admission: RE | Admit: 2016-06-08 | Discharge: 2016-06-08 | Disposition: A | Discharge status: Home / Self Care | Payer: Commercial Managed Care - PPO | Source: Ambulatory Visit | Attending: Internal Medicine | Admitting: Internal Medicine

## 2016-06-08 ENCOUNTER — Other Ambulatory Visit (HOSPITAL_COMMUNITY): Payer: Self-pay | Admitting: Internal Medicine

## 2016-06-08 DIAGNOSIS — M2341 Loose body in knee, right knee: Secondary | ICD-10-CM | POA: Diagnosis not present

## 2016-06-08 DIAGNOSIS — S8001XA Contusion of right knee, initial encounter: Secondary | ICD-10-CM | POA: Diagnosis not present

## 2016-06-08 DIAGNOSIS — M25561 Pain in right knee: Secondary | ICD-10-CM

## 2016-06-08 DIAGNOSIS — M1711 Unilateral primary osteoarthritis, right knee: Secondary | ICD-10-CM | POA: Insufficient documentation

## 2016-06-08 DIAGNOSIS — S8991XA Unspecified injury of right lower leg, initial encounter: Secondary | ICD-10-CM | POA: Diagnosis not present

## 2016-06-08 DIAGNOSIS — Z1389 Encounter for screening for other disorder: Secondary | ICD-10-CM | POA: Diagnosis not present

## 2016-11-15 DIAGNOSIS — Z0000 Encounter for general adult medical examination without abnormal findings: Secondary | ICD-10-CM | POA: Diagnosis not present

## 2016-11-15 DIAGNOSIS — Z1389 Encounter for screening for other disorder: Secondary | ICD-10-CM | POA: Diagnosis not present

## 2016-11-15 DIAGNOSIS — E063 Autoimmune thyroiditis: Secondary | ICD-10-CM | POA: Diagnosis not present

## 2016-11-15 DIAGNOSIS — Z0001 Encounter for general adult medical examination with abnormal findings: Secondary | ICD-10-CM | POA: Diagnosis not present

## 2016-11-15 DIAGNOSIS — L03115 Cellulitis of right lower limb: Secondary | ICD-10-CM | POA: Diagnosis not present

## 2016-11-15 DIAGNOSIS — L03116 Cellulitis of left lower limb: Secondary | ICD-10-CM | POA: Diagnosis not present

## 2017-01-25 DIAGNOSIS — R51 Headache: Secondary | ICD-10-CM | POA: Diagnosis not present

## 2017-01-25 DIAGNOSIS — A78 Q fever: Secondary | ICD-10-CM | POA: Diagnosis not present

## 2017-01-25 DIAGNOSIS — R6883 Chills (without fever): Secondary | ICD-10-CM | POA: Diagnosis not present

## 2017-01-25 DIAGNOSIS — R110 Nausea: Secondary | ICD-10-CM | POA: Diagnosis not present

## 2017-02-18 DIAGNOSIS — E039 Hypothyroidism, unspecified: Secondary | ICD-10-CM | POA: Diagnosis not present

## 2017-02-21 DIAGNOSIS — Z1389 Encounter for screening for other disorder: Secondary | ICD-10-CM | POA: Diagnosis not present

## 2017-02-21 DIAGNOSIS — R05 Cough: Secondary | ICD-10-CM | POA: Diagnosis not present

## 2017-02-21 DIAGNOSIS — R0989 Other specified symptoms and signs involving the circulatory and respiratory systems: Secondary | ICD-10-CM | POA: Diagnosis not present

## 2017-02-22 ENCOUNTER — Other Ambulatory Visit (HOSPITAL_COMMUNITY): Payer: Self-pay | Admitting: Internal Medicine

## 2017-02-22 DIAGNOSIS — Z1231 Encounter for screening mammogram for malignant neoplasm of breast: Secondary | ICD-10-CM

## 2017-03-16 DIAGNOSIS — J22 Unspecified acute lower respiratory infection: Secondary | ICD-10-CM | POA: Diagnosis not present

## 2017-03-17 ENCOUNTER — Ambulatory Visit (HOSPITAL_COMMUNITY): Payer: Commercial Managed Care - PPO

## 2017-03-23 ENCOUNTER — Encounter (HOSPITAL_COMMUNITY): Payer: Self-pay

## 2017-03-23 ENCOUNTER — Ambulatory Visit (HOSPITAL_COMMUNITY)
Admission: RE | Admit: 2017-03-23 | Discharge: 2017-03-23 | Disposition: A | Discharge status: Home / Self Care | Payer: Commercial Managed Care - PPO | Source: Ambulatory Visit | Attending: Internal Medicine | Admitting: Internal Medicine

## 2017-03-23 DIAGNOSIS — Z1231 Encounter for screening mammogram for malignant neoplasm of breast: Secondary | ICD-10-CM | POA: Diagnosis not present

## 2017-04-08 DIAGNOSIS — M7582 Other shoulder lesions, left shoulder: Secondary | ICD-10-CM | POA: Diagnosis not present

## 2017-04-08 DIAGNOSIS — M779 Enthesopathy, unspecified: Secondary | ICD-10-CM | POA: Diagnosis not present

## 2017-04-08 DIAGNOSIS — Z1389 Encounter for screening for other disorder: Secondary | ICD-10-CM | POA: Diagnosis not present

## 2017-04-22 DIAGNOSIS — M542 Cervicalgia: Secondary | ICD-10-CM | POA: Diagnosis not present

## 2017-04-22 DIAGNOSIS — M25512 Pain in left shoulder: Secondary | ICD-10-CM | POA: Diagnosis not present

## 2017-04-26 DIAGNOSIS — M25512 Pain in left shoulder: Secondary | ICD-10-CM | POA: Diagnosis not present

## 2017-05-02 DIAGNOSIS — M25512 Pain in left shoulder: Secondary | ICD-10-CM | POA: Diagnosis not present

## 2017-06-15 DIAGNOSIS — S39012A Strain of muscle, fascia and tendon of lower back, initial encounter: Secondary | ICD-10-CM | POA: Diagnosis not present

## 2017-06-15 DIAGNOSIS — Z1389 Encounter for screening for other disorder: Secondary | ICD-10-CM | POA: Diagnosis not present

## 2017-06-15 DIAGNOSIS — Z6841 Body Mass Index (BMI) 40.0 and over, adult: Secondary | ICD-10-CM | POA: Diagnosis not present

## 2018-02-07 ENCOUNTER — Other Ambulatory Visit (HOSPITAL_COMMUNITY): Payer: Self-pay | Admitting: Internal Medicine

## 2018-02-07 DIAGNOSIS — Z1231 Encounter for screening mammogram for malignant neoplasm of breast: Secondary | ICD-10-CM

## 2018-03-30 ENCOUNTER — Ambulatory Visit (HOSPITAL_COMMUNITY): Payer: Commercial Managed Care - PPO

## 2018-04-06 ENCOUNTER — Ambulatory Visit (HOSPITAL_COMMUNITY)
Admission: RE | Admit: 2018-04-06 | Discharge: 2018-04-06 | Disposition: A | Discharge status: Home / Self Care | Payer: Commercial Managed Care - PPO | Source: Ambulatory Visit | Attending: Internal Medicine | Admitting: Internal Medicine

## 2018-04-06 DIAGNOSIS — Z1231 Encounter for screening mammogram for malignant neoplasm of breast: Secondary | ICD-10-CM | POA: Diagnosis present

## 2018-04-19 DIAGNOSIS — E063 Autoimmune thyroiditis: Secondary | ICD-10-CM | POA: Diagnosis not present

## 2018-04-19 DIAGNOSIS — Z1389 Encounter for screening for other disorder: Secondary | ICD-10-CM | POA: Diagnosis not present

## 2018-04-19 DIAGNOSIS — M5410 Radiculopathy, site unspecified: Secondary | ICD-10-CM | POA: Diagnosis not present

## 2018-05-05 DIAGNOSIS — E063 Autoimmune thyroiditis: Secondary | ICD-10-CM | POA: Diagnosis not present

## 2018-05-05 DIAGNOSIS — E669 Obesity, unspecified: Secondary | ICD-10-CM | POA: Diagnosis not present

## 2018-05-05 DIAGNOSIS — M5431 Sciatica, right side: Secondary | ICD-10-CM | POA: Diagnosis not present

## 2018-05-05 DIAGNOSIS — Z1389 Encounter for screening for other disorder: Secondary | ICD-10-CM | POA: Diagnosis not present

## 2018-06-09 DIAGNOSIS — M5442 Lumbago with sciatica, left side: Secondary | ICD-10-CM | POA: Diagnosis not present

## 2018-06-09 DIAGNOSIS — M545 Low back pain: Secondary | ICD-10-CM | POA: Diagnosis not present

## 2018-06-16 DIAGNOSIS — M545 Low back pain: Secondary | ICD-10-CM | POA: Diagnosis not present

## 2018-06-16 DIAGNOSIS — M5412 Radiculopathy, cervical region: Secondary | ICD-10-CM | POA: Diagnosis not present

## 2018-06-21 ENCOUNTER — Other Ambulatory Visit: Payer: Self-pay | Admitting: Neurosurgery

## 2018-06-21 DIAGNOSIS — M48062 Spinal stenosis, lumbar region with neurogenic claudication: Secondary | ICD-10-CM | POA: Diagnosis not present

## 2018-06-21 DIAGNOSIS — Z6841 Body Mass Index (BMI) 40.0 and over, adult: Secondary | ICD-10-CM | POA: Diagnosis not present

## 2018-06-21 DIAGNOSIS — M4316 Spondylolisthesis, lumbar region: Secondary | ICD-10-CM | POA: Diagnosis not present

## 2018-07-06 NOTE — Pre-Procedure Instructions (Signed)
[id number] [place], [place] [address] [place] [address] Phone: [phone] Fax: [phone]      Your procedure is scheduled on  07-19-18 Wednesday from [id number]  Report to [facility] En[facility] "A" at 10 A.M., and check in at the Admitting office.  Call this number if you have problems the morning of surgery:  [phone]  Call [phone] if you have any questions prior to your surgery date Monday-Friday 8am-4pm    Remember:  Do not eat or drink after midnight.   Take these medicines the morning of surgery with A SIP OF WATER :              gabapentin (NEURONTIN)              levothyroxine (SYNTHROID)              As needed -      HYDROcodone-acetaminophen (NORCO)              7 days prior to surgery STOP taking any Aspirin (unless otherwise instructed by your surgeon), ALEVE, Naproxen, Ibuprofen, Motrin, Advil, Goody's, BC's, all herbal medications, fish oil, and all vitamins.    The Morning of Surgery  Do not wear jewelry, make-up or nail polish.  Do not wear lotions, powders, or perfumes/colognes, or deodorant  Do not shave 48 hours prior to surgery.  Men may shave face and neck.  Do not bring valuables to the hospital.  [facility] is not responsible for any belongings or valuables.  If you are a smoker, DO NOT Smoke 24 hours prior to surgery IF you wear a CPAP at night please bring your mask, tubing, and machine the morning of surgery   Remember that you must have someone to transport you home after your surgery, and remain with you for 24 hours if you are discharged the same day.   Contacts, glasses, hearing aids, dentures or bridgework may not be worn into surgery.    Leave your suitcase in the car.  After surgery it may be brought to your room.  For patients admitted to the hospital, discharge time will be determined by your treatment team.  Patients discharged the day of surgery  will not be allowed to drive home.    Special instructions:   Dana Holder- Preparing For Surgery  Before surgery, you can play an important role. Because skin is not sterile, your skin needs to be as free of germs as possible. You can reduce the number of germs on your skin by washing with CHG (chlorahexidine gluconate) Soap before surgery.  CHG is an antiseptic cleaner which kills germs and bonds with the skin to continue killing germs even after washing.    Oral Hygiene is also important to reduce your risk of infection.  Remember - BRUSH YOUR TEETH THE MORNING OF SURGERY WITH YOUR REGULAR TOOTHPASTE  Please do not use if you have an allergy to CHG or antibacterial soaps. If your skin becomes reddened/irritated stop using the CHG.  Do not shave (including legs and underarms) for at least 48 hours prior to first CHG shower. It is OK to shave your face.  Please follow these instructions carefully.   1. Shower the NIGHT BEFORE SURGERY and the MORNING OF SURGERY with CHG Soap.   2. If you chose to wash your hair, wash your hair first as usual with your normal shampoo.  3. After  you shampoo, rinse your hair and body thoroughly to remove the shampoo.  4. Use CHG as you would any other liquid soap. You can apply CHG directly to the skin and wash gently with a scrungie or a clean washcloth.   5. Apply the CHG Soap to your body ONLY FROM THE NECK DOWN.  Do not use on open wounds or open sores. Avoid contact with your eyes, ears, mouth and genitals (private parts). Wash Face and genitals (private parts)  with your normal soap.   6. Wash thoroughly, paying special attention to the area where your surgery will be performed.  7. Thoroughly rinse your body with warm water from the neck down.  8. DO NOT shower/wash with your normal soap after using and rinsing off the CHG Soap.  9. Pat yourself dry with a CLEAN TOWEL.  10. Wear CLEAN PAJAMAS to bed the night before surgery, wear comfortable  clothes the morning of surgery  11. Place CLEAN SHEETS on your bed the night of your first shower and DO NOT SLEEP WITH PETS.    Day of Surgery:  Do not apply any deodorants/lotions.  Please wear clean clothes to the hospital/surgery center.   Remember to brush your teeth WITH YOUR REGULAR TOOTHPASTE.   Please read over the following fact sheets that you were given.

## 2018-07-07 ENCOUNTER — Other Ambulatory Visit: Payer: Self-pay

## 2018-07-07 ENCOUNTER — Encounter (HOSPITAL_COMMUNITY)
Admission: RE | Admit: 2018-07-07 | Discharge: 2018-07-07 | Disposition: A | Discharge status: Home / Self Care | Payer: Commercial Managed Care - PPO | Source: Ambulatory Visit | Attending: Neurosurgery | Admitting: Neurosurgery

## 2018-07-07 ENCOUNTER — Encounter (HOSPITAL_COMMUNITY): Payer: Self-pay

## 2018-07-07 DIAGNOSIS — Z01812 Encounter for preprocedural laboratory examination: Secondary | ICD-10-CM | POA: Diagnosis not present

## 2018-07-07 LAB — CBC
HCT: 42.5 % (ref 36.0–46.0)
Hemoglobin: 14.2 g/dL (ref 12.0–15.0)
MCH: 29.3 pg (ref 26.0–34.0)
MCHC: 33.4 g/dL (ref 30.0–36.0)
MCV: 87.6 fL (ref 80.0–100.0)
Platelets: 229 10*3/uL (ref 150–400)
RBC: 4.85 MIL/uL (ref 3.87–5.11)
RDW: 12.2 % (ref 11.5–15.5)
WBC: 6.9 10*3/uL (ref 4.0–10.5)
nRBC: 0 % (ref 0.0–0.2)

## 2018-07-07 LAB — BASIC METABOLIC PANEL
Anion gap: 13 (ref 5–15)
BUN: 17 mg/dL (ref 8–23)
CO2: 21 mmol/L — ABNORMAL LOW (ref 22–32)
Calcium: 9.9 mg/dL (ref 8.9–10.3)
Chloride: 106 mmol/L (ref 98–111)
Creatinine, Ser: 1.08 mg/dL — ABNORMAL HIGH (ref 0.44–1.00)
GFR calc Af Amer: >60 mL/min (ref >60)
GFR calc non Af Amer: 55 mL/min — ABNORMAL LOW (ref >60)
Glucose, Bld: 94 mg/dL (ref 70–99)
Potassium: 4.6 mmol/L (ref 3.5–5.1)
Sodium: 140 mmol/L (ref 135–145)

## 2018-07-07 LAB — SURGICAL PCR SCREEN
MRSA, PCR: NEGATIVE
Staphylococcus aureus: NEGATIVE

## 2018-07-07 LAB — TYPE AND SCREEN
ABO/RH(D): B POS
Antibody Screen: NEGATIVE

## 2018-07-07 LAB — ABO/RH: ABO/RH(D): B POS

## 2018-07-07 NOTE — Progress Notes (Signed)
PCP - [employer] Cardiologist - denies  Chest x-ray - N/A EKG - N/A  Anesthesia review: N/A  Patient denies shortness of breath, fever, cough and chest pain at PAT appointment   Patient verbalized understanding of instructions that were given to them at the PAT appointment. Patient was also instructed that they will need to review over the PAT instructions again at home before surgery.

## 2018-07-17 ENCOUNTER — Other Ambulatory Visit (HOSPITAL_COMMUNITY): Payer: Commercial Managed Care - PPO

## 2018-07-18 ENCOUNTER — Other Ambulatory Visit (HOSPITAL_COMMUNITY)
Admission: RE | Admit: 2018-07-18 | Discharge: 2018-07-18 | Disposition: A | Discharge status: Home / Self Care | Payer: Commercial Managed Care - PPO | Source: Ambulatory Visit | Attending: Neurosurgery | Admitting: Neurosurgery

## 2018-07-18 DIAGNOSIS — Z1159 Encounter for screening for other viral diseases: Secondary | ICD-10-CM | POA: Diagnosis not present

## 2018-07-19 LAB — NOVEL CORONAVIRUS, NAA (HOSP ORDER, SEND-OUT TO REF LAB; TAT 18-24 HRS): SARS-CoV-2, NAA: NOT DETECTED

## 2018-07-21 ENCOUNTER — Encounter (HOSPITAL_COMMUNITY): Payer: Self-pay | Admitting: Certified Registered Nurse Anesthetist

## 2018-07-21 ENCOUNTER — Inpatient Hospital Stay (HOSPITAL_COMMUNITY): Payer: Commercial Managed Care - PPO | Admitting: Certified Registered Nurse Anesthetist

## 2018-07-21 ENCOUNTER — Other Ambulatory Visit: Payer: Self-pay

## 2018-07-21 ENCOUNTER — Inpatient Hospital Stay (HOSPITAL_COMMUNITY)
Admission: RE | Admit: 2018-07-21 | Discharge: 2018-07-24 | DRG: 455 | Disposition: A | Discharge status: Home / Self Care | Payer: Commercial Managed Care - PPO | Attending: Neurosurgery | Admitting: Neurosurgery

## 2018-07-21 ENCOUNTER — Encounter (HOSPITAL_COMMUNITY)
Admission: RE | Disposition: A | Discharge status: Home / Self Care | Payer: Self-pay | Source: Home / Self Care | Attending: Neurosurgery

## 2018-07-21 ENCOUNTER — Inpatient Hospital Stay (HOSPITAL_COMMUNITY): Payer: Commercial Managed Care - PPO

## 2018-07-21 DIAGNOSIS — Z87891 Personal history of nicotine dependence: Secondary | ICD-10-CM

## 2018-07-21 DIAGNOSIS — Z419 Encounter for procedure for purposes other than remedying health state, unspecified: Secondary | ICD-10-CM

## 2018-07-21 DIAGNOSIS — E039 Hypothyroidism, unspecified: Secondary | ICD-10-CM | POA: Diagnosis present

## 2018-07-21 DIAGNOSIS — Z79899 Other long term (current) drug therapy: Secondary | ICD-10-CM

## 2018-07-21 DIAGNOSIS — Z79890 Hormone replacement therapy (postmenopausal): Secondary | ICD-10-CM | POA: Diagnosis not present

## 2018-07-21 DIAGNOSIS — M48061 Spinal stenosis, lumbar region without neurogenic claudication: Principal | ICD-10-CM | POA: Diagnosis present

## 2018-07-21 DIAGNOSIS — Z6839 Body mass index (BMI) 39.0-39.9, adult: Secondary | ICD-10-CM | POA: Diagnosis not present

## 2018-07-21 SURGERY — POSTERIOR LUMBAR FUSION 1 LEVEL
Anesthesia: General | Site: Back

## 2018-07-21 MED ORDER — MIDAZOLAM HCL 2 MG/2ML IJ SOLN: 0.5000 mg | Freq: Once | INTRAMUSCULAR | Status: DC | PRN

## 2018-07-21 MED ORDER — CEFAZOLIN SODIUM-DEXTROSE 2-4 GM/100ML-% IV SOLN
2.0000 g | INTRAVENOUS | Status: AC
  Administered 2018-07-21: 2 g via INTRAVENOUS

## 2018-07-21 MED ORDER — DEXAMETHASONE SODIUM PHOSPHATE 10 MG/ML IJ SOLN
10.0000 mg | INTRAMUSCULAR | Status: AC
  Administered 2018-07-21: 10 mg via INTRAVENOUS

## 2018-07-21 MED ORDER — PROMETHAZINE HCL 25 MG/ML IJ SOLN: 6.2500 mg | INTRAMUSCULAR | Status: DC | PRN

## 2018-07-21 MED ORDER — HYDROMORPHONE HCL 1 MG/ML IJ SOLN
INTRAMUSCULAR | Status: AC
  Filled 2018-07-21: qty 1

## 2018-07-21 MED ORDER — PHENYLEPHRINE 40 MCG/ML (10ML) SYRINGE FOR IV PUSH (FOR BLOOD PRESSURE SUPPORT)
PREFILLED_SYRINGE | INTRAVENOUS | Status: DC | PRN
  Administered 2018-07-21 (×3): 80 ug via INTRAVENOUS

## 2018-07-21 MED ORDER — FENTANYL CITRATE (PF) 250 MCG/5ML IJ SOLN
INTRAMUSCULAR | Status: AC
  Filled 2018-07-21: qty 5

## 2018-07-21 MED ORDER — BUPIVACAINE HCL (PF) 0.25 % IJ SOLN
INTRAMUSCULAR | Status: DC | PRN
  Administered 2018-07-21: 10 mL

## 2018-07-21 MED ORDER — THROMBIN 20000 UNITS EX SOLR
CUTANEOUS | Status: DC | PRN
  Administered 2018-07-21: 20 mL

## 2018-07-21 MED ORDER — SODIUM CHLORIDE 0.9% FLUSH
3.0000 mL | Freq: Two times a day (BID) | INTRAVENOUS | Status: DC
  Administered 2018-07-21 – 2018-07-24 (×6): 3 mL via INTRAVENOUS

## 2018-07-21 MED ORDER — BUPIVACAINE LIPOSOME 1.3 % IJ SUSP
20.0000 mL | INTRAMUSCULAR | Status: AC
  Administered 2018-07-21: 20 mL
  Filled 2018-07-21 (×2): qty 20

## 2018-07-21 MED ORDER — OXYCODONE HCL 5 MG PO TABS
ORAL_TABLET | ORAL | Status: AC
  Filled 2018-07-21: qty 2

## 2018-07-21 MED ORDER — ALUM & MAG HYDROXIDE-SIMETH 200-200-20 MG/5ML PO SUSP: 30.0000 mL | Freq: Four times a day (QID) | ORAL | Status: DC | PRN

## 2018-07-21 MED ORDER — DEXAMETHASONE SODIUM PHOSPHATE 10 MG/ML IJ SOLN
INTRAMUSCULAR | Status: AC
  Filled 2018-07-21: qty 1

## 2018-07-21 MED ORDER — LIDOCAINE 2% (20 MG/ML) 5 ML SYRINGE
INTRAMUSCULAR | Status: DC | PRN
  Administered 2018-07-21: 40 mg via INTRAVENOUS

## 2018-07-21 MED ORDER — LIDOCAINE-EPINEPHRINE 1 %-1:100000 IJ SOLN
INTRAMUSCULAR | Status: AC
  Filled 2018-07-21: qty 1

## 2018-07-21 MED ORDER — LACTATED RINGERS IV SOLN
INTRAVENOUS | Status: DC
  Administered 2018-07-21 (×2): via INTRAVENOUS

## 2018-07-21 MED ORDER — CHLORHEXIDINE GLUCONATE CLOTH 2 % EX PADS: 6.0000 | MEDICATED_PAD | Freq: Once | CUTANEOUS | Status: DC

## 2018-07-21 MED ORDER — GABAPENTIN 600 MG PO TABS
600.0000 mg | ORAL_TABLET | Freq: Two times a day (BID) | ORAL | Status: DC
  Administered 2018-07-21 – 2018-07-24 (×6): 600 mg via ORAL
  Filled 2018-07-21 (×6): qty 1

## 2018-07-21 MED ORDER — SODIUM CHLORIDE 0.9 % IV SOLN
INTRAVENOUS | Status: DC | PRN
  Administered 2018-07-21: 20 ug/min via INTRAVENOUS

## 2018-07-21 MED ORDER — ACETAMINOPHEN 325 MG PO TABS: 650.0000 mg | ORAL_TABLET | ORAL | Status: DC | PRN

## 2018-07-21 MED ORDER — PROPOFOL 10 MG/ML IV BOLUS
INTRAVENOUS | Status: DC | PRN
  Administered 2018-07-21: 100 mg via INTRAVENOUS

## 2018-07-21 MED ORDER — ONDANSETRON HCL 4 MG PO TABS: 4.0000 mg | ORAL_TABLET | Freq: Four times a day (QID) | ORAL | Status: DC | PRN

## 2018-07-21 MED ORDER — ONDANSETRON HCL 4 MG/2ML IJ SOLN
4.0000 mg | Freq: Four times a day (QID) | INTRAMUSCULAR | Status: DC | PRN
  Administered 2018-07-21: 4 mg via INTRAVENOUS
  Filled 2018-07-21: qty 2

## 2018-07-21 MED ORDER — BUPIVACAINE HCL (PF) 0.25 % IJ SOLN
INTRAMUSCULAR | Status: AC
  Filled 2018-07-21: qty 30

## 2018-07-21 MED ORDER — OXYCODONE HCL 5 MG PO TABS
10.0000 mg | ORAL_TABLET | ORAL | Status: DC | PRN
  Administered 2018-07-21 – 2018-07-24 (×9): 10 mg via ORAL
  Filled 2018-07-21 (×8): qty 2

## 2018-07-21 MED ORDER — MEPERIDINE HCL 25 MG/ML IJ SOLN: 6.2500 mg | INTRAMUSCULAR | Status: DC | PRN

## 2018-07-21 MED ORDER — MENTHOL 3 MG MT LOZG
1.0000 | LOZENGE | OROMUCOSAL | Status: DC | PRN
  Filled 2018-07-21: qty 9

## 2018-07-21 MED ORDER — NAPROXEN SODIUM 275 MG PO TABS
275.0000 mg | ORAL_TABLET | Freq: Every day | ORAL | Status: DC | PRN
  Filled 2018-07-21: qty 1

## 2018-07-21 MED ORDER — MIDAZOLAM HCL 5 MG/5ML IJ SOLN
INTRAMUSCULAR | Status: DC | PRN
  Administered 2018-07-21: 2 mg via INTRAVENOUS

## 2018-07-21 MED ORDER — ONDANSETRON HCL 4 MG/2ML IJ SOLN
INTRAMUSCULAR | Status: DC | PRN
  Administered 2018-07-21: 4 mg via INTRAVENOUS

## 2018-07-21 MED ORDER — EPHEDRINE SULFATE-NACL 50-0.9 MG/10ML-% IV SOSY
PREFILLED_SYRINGE | INTRAVENOUS | Status: DC | PRN
  Administered 2018-07-21: 10 mg via INTRAVENOUS

## 2018-07-21 MED ORDER — LEVOTHYROXINE SODIUM 88 MCG PO TABS
88.0000 ug | ORAL_TABLET | Freq: Every day | ORAL | Status: DC
  Administered 2018-07-22 – 2018-07-24 (×3): 88 ug via ORAL
  Filled 2018-07-21 (×3): qty 1

## 2018-07-21 MED ORDER — HYDROMORPHONE HCL 1 MG/ML IJ SOLN: 1.0000 mg | INTRAMUSCULAR | Status: DC | PRN

## 2018-07-21 MED ORDER — PHENOL 1.4 % MT LIQD: 1.0000 | OROMUCOSAL | Status: DC | PRN

## 2018-07-21 MED ORDER — ACETAMINOPHEN 650 MG RE SUPP: 650.0000 mg | RECTAL | Status: DC | PRN

## 2018-07-21 MED ORDER — SODIUM CHLORIDE 0.9 % IV SOLN: 250.0000 mL | INTRAVENOUS | Status: DC

## 2018-07-21 MED ORDER — LIDOCAINE-EPINEPHRINE 1 %-1:100000 IJ SOLN
INTRAMUSCULAR | Status: DC | PRN
  Administered 2018-07-21: 10 mL

## 2018-07-21 MED ORDER — GLYCOPYRROLATE PF 0.2 MG/ML IJ SOSY
PREFILLED_SYRINGE | INTRAMUSCULAR | Status: DC | PRN
  Administered 2018-07-21: .2 mg via INTRAVENOUS

## 2018-07-21 MED ORDER — MIDAZOLAM HCL 2 MG/2ML IJ SOLN
INTRAMUSCULAR | Status: AC
  Filled 2018-07-21: qty 2

## 2018-07-21 MED ORDER — ONDANSETRON HCL 4 MG/2ML IJ SOLN
INTRAMUSCULAR | Status: AC
  Filled 2018-07-21: qty 4

## 2018-07-21 MED ORDER — ALBUMIN HUMAN 5 % IV SOLN
INTRAVENOUS | Status: DC | PRN
  Administered 2018-07-21: 16:00:00 via INTRAVENOUS

## 2018-07-21 MED ORDER — HYDROCODONE-ACETAMINOPHEN 10-325 MG PO TABS
1.0000 | ORAL_TABLET | Freq: Four times a day (QID) | ORAL | Status: DC | PRN
  Administered 2018-07-21 – 2018-07-23 (×5): 2 via ORAL
  Administered 2018-07-23 (×2): 1 via ORAL
  Filled 2018-07-21 (×6): qty 2
  Filled 2018-07-21: qty 1

## 2018-07-21 MED ORDER — PHENYLEPHRINE 40 MCG/ML (10ML) SYRINGE FOR IV PUSH (FOR BLOOD PRESSURE SUPPORT)
PREFILLED_SYRINGE | INTRAVENOUS | Status: AC
  Filled 2018-07-21: qty 10

## 2018-07-21 MED ORDER — EPHEDRINE 5 MG/ML INJ
INTRAVENOUS | Status: AC
  Filled 2018-07-21: qty 10

## 2018-07-21 MED ORDER — 0.9 % SODIUM CHLORIDE (POUR BTL) OPTIME
TOPICAL | Status: DC | PRN
  Administered 2018-07-21 (×2): 1000 mL

## 2018-07-21 MED ORDER — SODIUM CHLORIDE 0.9 % IV SOLN
INTRAVENOUS | Status: DC | PRN
  Administered 2018-07-21: 14:00:00 500 mL

## 2018-07-21 MED ORDER — ROCURONIUM BROMIDE 100 MG/10ML IV SOLN
INTRAVENOUS | Status: DC | PRN
  Administered 2018-07-21: 20 mg via INTRAVENOUS
  Administered 2018-07-21: 70 mg via INTRAVENOUS
  Administered 2018-07-21: 20 mg via INTRAVENOUS
  Administered 2018-07-21: 10 mg via INTRAVENOUS

## 2018-07-21 MED ORDER — LIDOCAINE 2% (20 MG/ML) 5 ML SYRINGE
INTRAMUSCULAR | Status: AC
  Filled 2018-07-21: qty 5

## 2018-07-21 MED ORDER — SUGAMMADEX SODIUM 200 MG/2ML IV SOLN
INTRAVENOUS | Status: DC | PRN
  Administered 2018-07-21: 200 mg via INTRAVENOUS

## 2018-07-21 MED ORDER — THROMBIN 20000 UNITS EX SOLR
CUTANEOUS | Status: AC
  Filled 2018-07-21: qty 20000

## 2018-07-21 MED ORDER — ROCURONIUM BROMIDE 10 MG/ML (PF) SYRINGE
PREFILLED_SYRINGE | INTRAVENOUS | Status: AC
  Filled 2018-07-21: qty 10

## 2018-07-21 MED ORDER — SODIUM CHLORIDE 0.9% FLUSH: 3.0000 mL | INTRAVENOUS | Status: DC | PRN

## 2018-07-21 MED ORDER — CYCLOBENZAPRINE HCL 10 MG PO TABS
10.0000 mg | ORAL_TABLET | Freq: Three times a day (TID) | ORAL | Status: DC | PRN
  Administered 2018-07-21 – 2018-07-23 (×5): 10 mg via ORAL
  Filled 2018-07-21 (×6): qty 1

## 2018-07-21 MED ORDER — HYDROMORPHONE HCL 1 MG/ML IJ SOLN
0.2500 mg | INTRAMUSCULAR | Status: DC | PRN
  Administered 2018-07-21 (×2): 0.25 mg via INTRAVENOUS
  Administered 2018-07-21 (×2): 0.5 mg via INTRAVENOUS

## 2018-07-21 MED ORDER — CEFAZOLIN SODIUM-DEXTROSE 2-4 GM/100ML-% IV SOLN
2.0000 g | Freq: Three times a day (TID) | INTRAVENOUS | Status: AC
  Administered 2018-07-21 – 2018-07-23 (×6): 2 g via INTRAVENOUS
  Filled 2018-07-21 (×6): qty 100

## 2018-07-21 MED ORDER — CEFAZOLIN SODIUM-DEXTROSE 2-4 GM/100ML-% IV SOLN
INTRAVENOUS | Status: AC
  Filled 2018-07-21: qty 100

## 2018-07-21 MED ORDER — PANTOPRAZOLE SODIUM 40 MG IV SOLR
40.0000 mg | Freq: Every day | INTRAVENOUS | Status: DC
  Administered 2018-07-21 – 2018-07-23 (×3): 40 mg via INTRAVENOUS
  Filled 2018-07-21 (×3): qty 40

## 2018-07-21 MED ORDER — FENTANYL CITRATE (PF) 100 MCG/2ML IJ SOLN
INTRAMUSCULAR | Status: DC | PRN
  Administered 2018-07-21 (×2): 25 ug via INTRAVENOUS
  Administered 2018-07-21: 50 ug via INTRAVENOUS
  Administered 2018-07-21: 150 ug via INTRAVENOUS

## 2018-07-21 MED ORDER — SCOPOLAMINE 1 MG/3DAYS TD PT72
1.0000 | MEDICATED_PATCH | Freq: Once | TRANSDERMAL | Status: AC
  Administered 2018-07-21: 1 via TRANSDERMAL

## 2018-07-21 SURGICAL SUPPLY — 72 items
BAG DECANTER FOR FLEXI CONT (MISCELLANEOUS) ×2 IMPLANT
BENZOIN TINCTURE PRP APPL 2/3 (GAUZE/BANDAGES/DRESSINGS) ×2 IMPLANT
BLADE CLIPPER SURG (BLADE) IMPLANT
BLADE SURG 11 STRL SS (BLADE) ×2 IMPLANT
BONE VIVIGEN FORMABLE 5.4CC (Bone Implant) ×2 IMPLANT
BUR CUTTER 7.0 ROUND (BURR) ×2 IMPLANT
BUR MATCHSTICK NEURO 3.0 LAGG (BURR) ×2 IMPLANT
CANISTER SUCT 3000ML PPV (MISCELLANEOUS) ×2 IMPLANT
CAP REVERE LOCKING (Cap) ×4 IMPLANT
CARTRIDGE OIL MAESTRO DRILL (MISCELLANEOUS) ×1 IMPLANT
CONT SPEC 4OZ CLIKSEAL STRL BL (MISCELLANEOUS) ×2 IMPLANT
COVER BACK TABLE 60X90IN (DRAPES) ×2 IMPLANT
COVER WAND RF STERILE (DRAPES) IMPLANT
DECANTER SPIKE VIAL GLASS SM (MISCELLANEOUS) ×2 IMPLANT
DERMABOND ADVANCED (GAUZE/BANDAGES/DRESSINGS) ×1
DERMABOND ADVANCED .7 DNX12 (GAUZE/BANDAGES/DRESSINGS) ×1 IMPLANT
DIFFUSER DRILL AIR PNEUMATIC (MISCELLANEOUS) ×2 IMPLANT
DRAPE C-ARM 42X72 X-RAY (DRAPES) ×4 IMPLANT
DRAPE C-ARMOR (DRAPES) IMPLANT
DRAPE HALF SHEET 40X57 (DRAPES) IMPLANT
DRAPE LAPAROTOMY 100X72X124 (DRAPES) ×2 IMPLANT
DRAPE SURG 17X23 STRL (DRAPES) ×2 IMPLANT
DRSG OPSITE 4X5.5 SM (GAUZE/BANDAGES/DRESSINGS) ×2 IMPLANT
DRSG OPSITE POSTOP 4X6 (GAUZE/BANDAGES/DRESSINGS) ×2 IMPLANT
DURAPREP 26ML APPLICATOR (WOUND CARE) ×2 IMPLANT
ELECT REM PT RETURN 9FT ADLT (ELECTROSURGICAL) ×2
ELECTRODE REM PT RTRN 9FT ADLT (ELECTROSURGICAL) ×1 IMPLANT
EVACUATOR 1/8 PVC DRAIN (DRAIN) ×2 IMPLANT
EVACUATOR 3/16  PVC DRAIN (DRAIN)
EVACUATOR 3/16 PVC DRAIN (DRAIN) IMPLANT
FORCEPS BIPOLAR SPETZLER 8 1.0 (NEUROSURGERY SUPPLIES) ×2 IMPLANT
GAUZE 4X4 16PLY RFD (DISPOSABLE) IMPLANT
GAUZE SPONGE 4X4 12PLY STRL (GAUZE/BANDAGES/DRESSINGS) ×2 IMPLANT
GLOVE BIO SURGEON STRL SZ7 (GLOVE) IMPLANT
GLOVE BIO SURGEON STRL SZ8 (GLOVE) ×4 IMPLANT
GLOVE BIOGEL PI IND STRL 7.0 (GLOVE) IMPLANT
GLOVE BIOGEL PI INDICATOR 7.0 (GLOVE)
GLOVE ECLIPSE 7.5 STRL STRAW (GLOVE) IMPLANT
GLOVE INDICATOR 8.5 STRL (GLOVE) ×4 IMPLANT
GLOVE SS N UNI LF 7.0 STRL (GLOVE) ×8 IMPLANT
GLOVE SS N UNI LF 7.5 STRL (GLOVE) ×6 IMPLANT
GOWN STRL REUS W/ TWL LRG LVL3 (GOWN DISPOSABLE) ×3 IMPLANT
GOWN STRL REUS W/ TWL XL LVL3 (GOWN DISPOSABLE) ×2 IMPLANT
GOWN STRL REUS W/TWL 2XL LVL3 (GOWN DISPOSABLE) IMPLANT
GOWN STRL REUS W/TWL LRG LVL3 (GOWN DISPOSABLE) ×3
GOWN STRL REUS W/TWL XL LVL3 (GOWN DISPOSABLE) ×2
HEMOSTAT POWDER KIT SURGIFOAM (HEMOSTASIS) IMPLANT
KIT BASIN OR (CUSTOM PROCEDURE TRAY) ×2 IMPLANT
KIT TURNOVER KIT B (KITS) ×2 IMPLANT
MILL MEDIUM DISP (BLADE) ×2 IMPLANT
NEEDLE HYPO 25X1 1.5 SAFETY (NEEDLE) ×2 IMPLANT
NS IRRIG 1000ML POUR BTL (IV SOLUTION) ×4 IMPLANT
OIL CARTRIDGE MAESTRO DRILL (MISCELLANEOUS) ×2
PACK LAMINECTOMY NEURO (CUSTOM PROCEDURE TRAY) ×2 IMPLANT
PAD ARMBOARD 7.5X6 YLW CONV (MISCELLANEOUS) ×6 IMPLANT
PATTIES SURGICAL 1X1 (DISPOSABLE) ×2 IMPLANT
ROD REVERE 6.35 45MM (Rod) ×4 IMPLANT
SCREW DANEK NONBREAK (Screw) ×4 IMPLANT
SCREW REVERE 6.35 6.5X40MM (Screw) ×4 IMPLANT
SPACER SUSTAIN TI 9X22X12 15D (Spacer) ×4 IMPLANT
SPONGE LAP 4X18 RFD (DISPOSABLE) IMPLANT
SPONGE SURGIFOAM ABS GEL 100 (HEMOSTASIS) ×2 IMPLANT
STRIP CLOSURE SKIN 1/2X4 (GAUZE/BANDAGES/DRESSINGS) ×2 IMPLANT
SUT VIC AB 0 CT1 18XCR BRD8 (SUTURE) ×2 IMPLANT
SUT VIC AB 0 CT1 8-18 (SUTURE) ×2
SUT VIC AB 2-0 CT1 18 (SUTURE) ×4 IMPLANT
SUT VIC AB 4-0 PS2 27 (SUTURE) ×2 IMPLANT
SYR 20CC LL (SYRINGE) ×2 IMPLANT
TOWEL GREEN STERILE (TOWEL DISPOSABLE) ×2 IMPLANT
TOWEL GREEN STERILE FF (TOWEL DISPOSABLE) ×2 IMPLANT
TRAY FOLEY MTR SLVR 16FR STAT (SET/KITS/TRAYS/PACK) ×2 IMPLANT
WATER STERILE IRR 1000ML POUR (IV SOLUTION) ×2 IMPLANT

## 2018-07-21 NOTE — Transfer of Care (Signed)
Immediate Anesthesia Transfer of Care Note  Patient: Dana Holder  Procedure(s) Performed: PLIF extension  - L4-L5 - Posterior Lateral and Interbody fusion (N/A Back)  Patient Location: PACU  Anesthesia Type:General  Level of Consciousness: awake, alert , oriented and patient cooperative  Airway & Oxygen Therapy: Patient Spontanous Breathing and Patient connected to face mask oxygen  Post-op Assessment: Report given to RN and Post -op Vital signs reviewed and stable  Post vital signs: Reviewed and stable  Last Vitals:  Vitals Value Taken Time  BP 134/120 07/21/18 1645  Temp    Pulse 85 07/21/18 1646  Resp 14 07/21/18 1646  SpO2 100 % 07/21/18 1646  Vitals shown include unvalidated device data.  Last Pain:  Vitals:   07/21/18 1146  TempSrc:   PainSc: 0-No pain         Complications: No apparent anesthesia complications

## 2018-07-21 NOTE — Anesthesia Preprocedure Evaluation (Addendum)
Anesthesia Evaluation  Patient identified by MRN, date of birth, ID band Patient awake    Reviewed: Allergy & Precautions, NPO status , Patient's Chart, lab work & pertinent test results  History of Anesthesia Complications Negative for: history of anesthetic complications  Airway Mallampati: II  TM Distance: >3 FB Neck ROM: Full    Dental  (+) Partial Lower, Partial Upper, Dental Advisory Given   Pulmonary former smoker (quit 2004),  07/18/2018 novel coronavirus NEG   breath sounds clear to auscultation       Cardiovascular negative cardio ROS   Rhythm:Regular Rate:Normal     Neuro/Psych Chronic back pain    GI/Hepatic negative GI ROS, Neg liver ROS,   Endo/Other  Hypothyroidism Morbid obesityobese  Renal/GU negative Renal ROS     Musculoskeletal   Abdominal (+) + obese,   Peds  Hematology negative hematology ROS (+)   Anesthesia Other Findings   Reproductive/Obstetrics                            Anesthesia Physical Anesthesia Plan  ASA: III  Anesthesia Plan: General   Post-op Pain Management:    Induction:   PONV Risk Score and Plan: 3 and Ondansetron, Dexamethasone and Scopolamine patch - Pre-op  Airway Management Planned: Oral ETT  Additional Equipment:   Intra-op Plan:   Post-operative Plan: Extubation in OR  Informed Consent: I have reviewed the patients History and Physical, chart, labs and discussed the procedure including the risks, benefits and alternatives for the proposed anesthesia with the patient or authorized representative who has indicated his/her understanding and acceptance.     Dental advisory given  Plan Discussed with: CRNA and Surgeon  Anesthesia Plan Comments:        Anesthesia Quick Evaluation

## 2018-07-21 NOTE — Progress Notes (Signed)
Pt arrived from PACU via stretcher, pt assisted over to room 4NP04 bed. Pt alert and oriented, instructed on room call bell, telephone, and bed controls.  Pt having some discomfort/pain to back, repositioned in bed for comfort, not time for pain medication at this time. Offered fluids to drink and food tray ordered. Pt calling family member, PACU RN Mickel Baas was to call pt's family that had come to hospital with pt this am for surgery to give the family member an update and location of pt postop. Bed alarm placed on pt's bed, and pt instructed to call staff for assistance.

## 2018-07-21 NOTE — H&P (Signed)
Dana Holder is an 63 y.o. female.   Chief Complaint: Back bilateral leg pain HPI: 63 year old female who previously underwent L5-S1 interbody fusion about 19 years ago very well over the last several weeks months is a progressive worsening back with bilateral leg pain work-up revealed progressive degenerative collapse of right above her fusion at L4-5.  Due to patient's failed conservative treatment imaging findings and progression of clinical syndrome I recommended decompressive laminectomy with interbody fusion at L4-5 with expected exploration fusion move of hardware L5-S1.  We have extensively gone over the risks and benefits of the operation with her as well as perioperative course expectations of outcome and alternatives of surgery and she understands and agrees to proceed forward.  Past Medical History:  Diagnosis Date  . Gout   . Hypothyroidism     Past Surgical History:  Procedure Laterality Date  . BACK SURGERY  2002   lumbar disc  . COLONOSCOPY N/A 12/08/2015   Procedure: COLONOSCOPY;  Surgeon: Danie Binder, MD;  Location: [facility];  Service: Endoscopy;  Laterality: N/A;  9:15 AM  . TUBAL LIGATION      Family History  Problem Relation Age of Onset  . Acute myelogenous leukemia Son   . Throat cancer Father   . Ovarian cancer Sister   . Lung cancer Brother   . Anesthesia problems Neg Hx   . Hypotension Neg Hx   . Malignant hyperthermia Neg Hx   . Pseudochol deficiency Neg Hx   . Colon cancer Neg Hx    Social History:  reports that she quit smoking about 16 years ago. Her smoking use included cigarettes. She has a 15.00 pack-year smoking history. She has never used smokeless tobacco. She reports that she does not drink alcohol or use drugs.  Allergies: No Known Allergies  Medications Prior to Admission  Medication Sig Dispense Refill  . gabapentin (NEURONTIN) 600 MG tablet Take 600 mg by mouth See admin instructions. Take 600 mg twice daily, may take a third  600 mg dose as needed for pain    . HYDROcodone-acetaminophen (NORCO) 10-325 MG tablet Take 1-2 tablets by mouth every 6 (six) hours as needed for moderate pain.    Marland Kitchen levothyroxine (SYNTHROID) 88 MCG tablet Take 88 mcg by mouth daily before breakfast.    . naproxen sodium (ALEVE) 220 MG tablet Take 220 mg by mouth daily as needed (pain).      No results found for this or any previous visit (from the past 48 hour(s)). No results found.  Review of Systems  Musculoskeletal: Positive for back pain.  Neurological: Positive for tingling and sensory change.    Blood pressure 106/70, pulse 70, temperature 98 F (36.7 C), temperature source Oral, resp. rate 18, SpO2 97 %. Physical Exam  Constitutional: She is oriented to person, place, and time. She appears well-developed and well-nourished.  HENT:  Head: Normocephalic.  Eyes: Pupils are equal, round, and reactive to light.  Neck: Normal range of motion.  Respiratory: Effort normal.  GI: Soft.  Musculoskeletal: Normal range of motion.  Neurological: She is alert and oriented to person, place, and time. She has normal strength. GCS eye subscore is 4. GCS verbal subscore is 5. GCS motor subscore is 6.  Strength 5 out of 5 iliopsoas, quads, hamstrings, gastroc, and tibialis, and EHL.  Skin: Skin is warm and dry.     Assessment/Plan 63 year old female presents for posterior lumbar interbody fusion L4-5 with removal of hardware L5-S1  Dana Holder P,  MD 07/21/2018, 12:22 PM

## 2018-07-21 NOTE — Op Note (Signed)
Preoperative diagnosis: Lumbar spinal stenosis bilateral L4-L5 radiculopathies  Postoperative diagnosis: Same  Procedure #1 decompressive lumbar laminectomy L4-5 in excess and requiring more work than would be needed with a standard interbody fusion including complete medial facetectomies radical foraminotomies of the L4 and L5 nerve roots bilaterally  2.  Exploration of fusion move of hardware L5-S1 with removal of bilateral rods and bilateral S1 pedicle screws.  3.  Posterior lumbar interbody fusion L4-5 utilizing the globus insert and rotate TPS coated peek cages packed with locally harvested autograft mixed with vivigen  4.  Placement bilateral L4 pedicle screws utilizing the globus Revere quarter-inch pedicle screw system tying into previous Medtronic CD Horizon pedicle screws left over and L5.  5.  Posterior lateral arthrodesis L4-5 utilizing locally harvested autograft mix  Surgeon: Dominica Severin Tarisa Paola  Assistant: Nash Shearer  Anesthesia: General  EBL: Minimal  HPI: 63 year old female with a longstanding back pain previously underwent L5-S1 fusion back in 2001 did very well over the last several weeks months of progressive worsening back and bilateral leg pain work-up revealed segmental degeneration above her previous fusion with severe spinal stenosis at that level.  Due to her failed conservative treatment imaging findings and progression of clinical syndrome I recommended decompressive laminectomy and fusion at that level.  I extensively went over the risks and benefits of the operation with her as well as perioperative course expectations of outcome and alternatives of surgery and she understood and agreed to proceed forward.  Operative procedure: Patient was brought into the ER was induced under general anesthesia positioned prone on the Wilson frame her back was prepped and draped in routine sterile fashion her old incision was opened up and extended slightly cephalad the scar tissue  was dissected free and subperiosteal dissection was carried out lamina of L4 and exposed the hardware at L5-S1.  I then remove the rods remove the knots removed the pedicle screws at S1 the L5-S1 fusion appear to be solid I then remove the spinous process at L4 drilled down the facet complexes and started the decompression.  I did a complete decompressive laminectomy at L4 complete medial facetectomies and marched the L4 and L5 nerve roots bilaterally.  There was an extensive scar tissue that had to free up over the L5 nerve root but decompress the L5 nerve root flush with the pedicle.  Then tension taken the interbody work to space was identified epidural veins were coagulated the space was cleaned out bilaterally utilizing sequential distraction with an 11 distractor in place I selected 9-12 insert and rotate cage packed with locally harvested autograft mix and after adequate discectomy and endplate preparation I inserted 1 cage packed and aggressive amount of autograft mix centrally and inserted the contralateral cage.  Under fluoroscopy each cage was noted to be in good position.  I then placed 2 pedicle screws at L4 bilaterally under fluoroscopy again postop fluoroscopy confirmed pedicle screws and the implants to be in good position then I copiously irrigated the wound meticulous hemostasis was maintained I aggressively decorticated the TPs lateral facet complexes at L4-5 packed the remainder the autograft mix bilaterally on the along the TPs from L4-L5 then assembled the rods anchored everything in place compressed the L4 against the L5 pedicle screw reinspected the foramen to confirm patency leg Gelfoam on the dura.  Placed a medium Hemovac drain injected Exparel in the fascia and closed the wound in layers with interrupted Vicryl in a running 4 subcuticular Dermabond benzoin Steri-Strips and a sterile dressing  was applied patient recovery in stable condition.  At the end the case all needle count sponge  counts were correct.

## 2018-07-21 NOTE — Progress Notes (Signed)
Called to update on the patient's status to the patient's emergency contact with permission of patient.

## 2018-07-21 NOTE — Anesthesia Postprocedure Evaluation (Signed)
Anesthesia Post Note  Patient: Dana Holder  Procedure(s) Performed: PLIF extension  - L4-L5 - Posterior Lateral and Interbody fusion (N/A Back)     Patient location during evaluation: PACU Anesthesia Type: General Level of consciousness: awake and alert Pain control: PACU team still working on managing patient's pain. Vital Signs Assessment: post-procedure vital signs reviewed and stable Respiratory status: spontaneous breathing, nonlabored ventilation, respiratory function stable and patient connected to nasal cannula oxygen Cardiovascular status: blood pressure returned to baseline and stable Postop Assessment: no apparent nausea or vomiting Anesthetic complications: no    Last Vitals:  Vitals:   07/21/18 1700 07/21/18 1715  BP: 102/67 97/61  Pulse: 91 81  Resp: 15 19  Temp:    SpO2: 100% 100%    Last Pain:  Vitals:   07/21/18 1717  TempSrc:   PainSc: 10-Worst pain ever                 [name],E. Laryah Neuser

## 2018-07-21 NOTE — Progress Notes (Signed)
Attempted report 

## 2018-07-21 NOTE — Anesthesia Procedure Notes (Signed)
Procedure Name: Intubation Date/Time: 07/21/2018 1:08 PM Performed by: Candis Shine, CRNA Pre-anesthesia Checklist: Patient identified, Emergency Drugs available, Suction available and Patient being monitored Patient Re-evaluated:Patient Re-evaluated prior to induction Oxygen Delivery Method: Circle System Utilized Preoxygenation: Pre-oxygenation with 100% oxygen Induction Type: IV induction Ventilation: Mask ventilation without difficulty Laryngoscope Size: Mac and 4 Grade View: Grade I Tube type: Oral Tube size: 7.0 mm Number of attempts: 1 Airway Equipment and Method: Stylet Placement Confirmation: ETT inserted through vocal cords under direct vision,  positive ETCO2 and breath sounds checked- equal and bilateral Secured at: 22 cm Tube secured with: Tape Dental Injury: Teeth and Oropharynx as per pre-operative assessment

## 2018-07-22 NOTE — Evaluation (Signed)
Occupational Therapy Evaluation Patient Details Name: Dana Holder MRN: [id number] DOB: [birth year] Today's Date: 07/22/2018    History of Present Illness Pt is a 63 y.o. F with significant PMH of L5-S1 interbody fusion in 2001. Now presents s/p removal of hardware L5-S1, decompressive lumbar laminectomy L4-5, posterior lumbar interbody fusion L4-5   Clinical Impression   PTA patient independent.  Admitted for above and limited by problem list below, including pain, precautions.  She requires min cueing to adhere to spinal precautions during session.  Completed toilet transfers with supervision using RW, grooming standing at sink with supervision, LB ADLs with supervision using figure 4 technique. She was educated on back precautions, safety, brace mgmt and wear schedule, DME, and ADL compensatory techniques. She will benefit from continued OT services while admitted in order to maximize adherence to spinal precautions, safety and independence with ADLs/mobility but anticipate no further needs after dc.     Follow Up Recommendations  No OT follow up;Supervision - Intermittent    Equipment Recommendations  3 in 1 bedside commode    Recommendations for Other Services       Precautions / Restrictions Precautions Precautions: Back Precaution Booklet Issued: Yes (comment) Precaution Comments: verbally reviewed and provided written handout Required Braces or Orthoses: Spinal Brace Spinal Brace: Lumbar corset;Applied in sitting position Restrictions Weight Bearing Restrictions: No      Mobility Bed Mobility Overal bed mobility: Needs Assistance Bed Mobility: Rolling;Sidelying to Sit;Sit to Sidelying Rolling: Supervision Sidelying to sit: Supervision     Sit to sidelying: Supervision General bed mobility comments: requires supervision for log roll technique, initated movement towards EOB "normally" but able to complete correctly with cueing   Transfers Overall transfer level:  Needs assistance Equipment used: Rolling walker (2 wheeled) Transfers: Sit to/from Stand Sit to Stand: Supervision         General transfer comment: supervision for safety, cueing for hand placement     Balance Overall balance assessment: Mild deficits observed, not formally tested                                         ADL either performed or assessed with clinical judgement   ADL Overall ADL's : Needs assistance/impaired     Grooming: Standing;Supervision/safety;Wash/dry hands   Upper Body Bathing: Set up;Sitting   Lower Body Bathing: Supervison/ safety;Sit to/from stand Lower Body Bathing Details (indicate cue type and reason): pt able to complete figure 4 technique, reviewed bathing seated for safety  Upper Body Dressing : Set up;Supervision/safety;Sitting Upper Body Dressing Details (indicate cue type and reason): min cueing for brace mgmt  Lower Body Dressing: Sit to/from stand;Supervision/safety;Cueing for safety;Cueing for compensatory techniques;Cueing for back precautions Lower Body Dressing Details (indicate cue type and reason): pt able to complete figure 4 technique with increased time and effort, close supervision sit<>stand  Toilet Transfer: Supervision/safety;Ambulation;RW;BSC;Comfort height toilet Toilet Transfer Details (indicate cue type and reason): 3:1 over commode         Functional mobility during ADLs: Supervision/safety;Rolling walker General ADL Comments: pt limited by pain, decreased activity tolerance and precaution adherance     Vision         Perception     Praxis      Pertinent Vitals/Pain Pain Assessment: Faces Faces Pain Scale: Hurts little more Pain Location: incisional- back Pain Descriptors / Indicators: Operative site guarding Pain Intervention(s): Monitored during session;Repositioned  Hand Dominance     Extremity/Trunk Assessment Upper Extremity Assessment Upper Extremity Assessment: Overall WFL  for tasks assessed   Lower Extremity Assessment Lower Extremity Assessment: Defer to PT evaluation   Cervical / Trunk Assessment Cervical / Trunk Assessment: Other exceptions Cervical / Trunk Exceptions: s/p PLIF   Communication Communication Communication: No difficulties   Cognition Arousal/Alertness: Awake/alert Behavior During Therapy: WFL for tasks assessed/performed Overall Cognitive Status: Within Functional Limits for tasks assessed                                 General Comments: min cueing for back precautions, recall of brace mgmt and safety--anticipate medication related   General Comments       Exercises     Shoulder Instructions      Home Living Family/patient expects to be discharged to:: Private residence Living Arrangements: Spouse/significant other Available Help at Discharge: Family;Available 24 hours/day;Friend(s)(for the first week) Type of Home: House Home Access: Stairs to enter [employer] of Steps: 1   Home Layout: One level     Bathroom Shower/Tub: Teacher, early years/pre: Standard     Home Equipment: Shower seat          Prior Functioning/Environment Level of Independence: Independent                 OT Problem List: Decreased activity tolerance;Decreased safety awareness;Decreased knowledge of use of DME or AE;Decreased knowledge of precautions;Pain      OT Treatment/Interventions: Self-care/ADL training;Energy conservation;DME and/or AE instruction;Therapeutic activities;Patient/family education;Balance training    OT Goals(Current goals can be found in the care plan section) Acute Rehab OT Goals Patient Stated Goal: "go home tomorrow." OT Goal Formulation: With patient Time For Goal Achievement: 08/05/18 Potential to Achieve Goals: Good  OT Frequency: Min 2X/week   Barriers to D/C:            Co-evaluation              AM-PAC OT "6 Clicks" Daily Activity     Outcome  Measure Help from another person eating meals?: None Help from another person taking care of personal grooming?: None Help from another person toileting, which includes using toliet, bedpan, or urinal?: None Help from another person bathing (including washing, rinsing, drying)?: None Help from another person to put on and taking off regular upper body clothing?: None Help from another person to put on and taking off regular lower body clothing?: None 6 Click Score: 24   End of Session Equipment Utilized During Treatment: Rolling walker;Back brace Nurse Communication: Mobility status  Activity Tolerance: Patient tolerated treatment well Patient left: in bed;with call bell/phone within reach;with bed alarm set  OT Visit Diagnosis: Other abnormalities of gait and mobility (R26.89);Pain Pain - part of body: (back-incisional)                Time: 1610-9604 OT Time Calculation (min): 20 min Charges:  OT General Charges $OT Visit: 1 Visit OT Evaluation $OT Eval Low Complexity: [place], OT Acute Rehabilitation Services Pager [phone] Office [phone]   Delight Stare 07/22/2018, 12:44 PM

## 2018-07-22 NOTE — Progress Notes (Signed)
Orthopedic Tech Progress Note Patient Details:  Dana Holder 05-29-1955 [id number] Called in order to [facility] for an [place]   Patient ID: Dana Holder, female   DOB: 1955-10-18, 63 y.o.   MRN: [id number]   [name] 07/22/2018, 8:47 AM

## 2018-07-22 NOTE — Social Work (Signed)
CSW acknowledging consult for SNF.  Pt with no PT f/u recommended, CSW signing off. Please consult if any additional needs arise.   [place], [place] Work [phone]

## 2018-07-22 NOTE — Progress Notes (Signed)
Postop day 1.  Patient overall doing well.  Back pain reasonably well controlled.  Denies lower extremity pain.  Standing and walking with minimal difficulty.  Still having difficulty arising from a chair or bed.  Feels like she needs at least 1 more day of hospitalization.  Afebrile.  Vital signs are stable.  Awake and alert.  Oriented and appropriate.  Motor and sensory function stable.  Wound clean and dry.  Abdomen soft.  Progressing reasonably well following lumbar decompression and fusion surgery.  Continue efforts at mobilization.  Likely discharge home tomorrow.

## 2018-07-22 NOTE — Evaluation (Signed)
Physical Therapy Evaluation Patient Details Name: FRENCHIE PRIBYL MRN: [id number] DOB: 24-Jun-1955 Today's Date: 07/22/2018   History of Present Illness  Pt is a 63 y.o. F with significant PMH of L5-S1 interbody fusion in 2001. Now presents s/p removal of hardware L5-S1, decompressive lumbar laminectomy L4-5, posterior lumbar interbody fusion L4-5  Clinical Impression  Patient is s/p above surgery resulting in the deficits listed below (see PT Problem List). Pt presents with decreased functional mobility secondary to back pain and decreased activity tolerance. Ambulating 300 feet with walker and supervision. Education re: spinal precautions, generalized walking program, proper use of brace. Patient will benefit from skilled PT to increase their independence and safety with mobility (while adhering to their precautions) to allow discharge to the venue listed below.     Follow Up Recommendations No PT follow up    Equipment Recommendations  Rolling walker with 5" wheels;3in1 (PT)    Recommendations for Other Services       Precautions / Restrictions Precautions Precautions: Back Precaution Booklet Issued: Yes (comment) Precaution Comments: verbally reviewed and provided written handout Required Braces or Orthoses: Spinal Brace Spinal Brace: Lumbar corset;Applied in sitting position Restrictions Weight Bearing Restrictions: No      Mobility  Bed Mobility Overal bed mobility: Modified Independent             General bed mobility comments: Cues for log roll technique, exiting bed towards left. HOB flat, use of bed rail to pull over  Transfers Overall transfer level: Modified independent Equipment used: Rolling walker (2 wheeled)             General transfer comment: Cues for hand placement  Ambulation/Gait Ambulation/Gait assistance: Supervision Gait Distance (Feet): 300 Feet Assistive device: Rolling walker (2 wheeled) Gait Pattern/deviations: Step-through  pattern;Decreased stride length Gait velocity: decr   General Gait Details: Pt with shortened stride length, good posture, slower speed. Reliance on walker  Stairs            Wheelchair Mobility    Modified Rankin (Stroke Patients Only)       Balance Overall balance assessment: Mild deficits observed, not formally tested                                           Pertinent Vitals/Pain Pain Assessment: Faces Faces Pain Scale: Hurts little more Pain Location: incisional Pain Descriptors / Indicators: Operative site guarding Pain Intervention(s): Monitored during session    Home Living Family/patient expects to be discharged to:: Private residence Living Arrangements: Spouse/significant other Available Help at Discharge: Family;Available 24 hours/day;Friend(s)(for first week) Type of Home: House Home Access: Stairs to enter   [employer] of Steps: 1 Home Layout: One level Home Equipment: Shower seat      Prior Function Level of Independence: Independent               Hand Dominance        Extremity/Trunk Assessment   Upper Extremity Assessment Upper Extremity Assessment: Overall WFL for tasks assessed    Lower Extremity Assessment Lower Extremity Assessment: Overall WFL for tasks assessed    Cervical / Trunk Assessment Cervical / Trunk Assessment: Other exceptions Cervical / Trunk Exceptions: s/p PLIF  Communication   Communication: No difficulties  Cognition Arousal/Alertness: Awake/alert Behavior During Therapy: WFL for tasks assessed/performed Overall Cognitive Status: Within Functional Limits for tasks assessed  General Comments      Exercises     Assessment/Plan    PT Assessment Patient needs continued PT services  PT Problem List Decreased activity tolerance;Decreased balance;Decreased mobility;Pain       PT Treatment Interventions DME  instruction;Gait training;Stair training;Functional mobility training;Therapeutic activities;Therapeutic exercise;Balance training;Patient/family education    PT Goals (Current goals can be found in the Care Plan section)  Acute Rehab PT Goals Patient Stated Goal: "go home tomorrow." PT Goal Formulation: With patient Time For Goal Achievement: 07/27/18 Potential to Achieve Goals: Good    Frequency Min 5X/week   Barriers to discharge        Co-evaluation               AM-PAC PT "6 Clicks" Mobility  Outcome Measure Help needed turning from your back to your side while in a flat bed without using bedrails?: None Help needed moving from lying on your back to sitting on the side of a flat bed without using bedrails?: None Help needed moving to and from a bed to a chair (including a wheelchair)?: None Help needed standing up from a chair using your arms (e.g., wheelchair or bedside chair)?: None Help needed to walk in hospital room?: None Help needed climbing 3-5 steps with a railing? : A Little 6 Click Score: 23    End of Session Equipment Utilized During Treatment: Back brace Activity Tolerance: Patient tolerated treatment well Patient left: in chair;with call bell/phone within reach Nurse Communication: Mobility status PT Visit Diagnosis: Pain;Difficulty in walking, not elsewhere classified (R26.2) Pain - part of body: (back)    Time: 8469-6295 PT Time Calculation (min) (ACUTE ONLY): 31 min   Charges:   PT Evaluation $PT Eval Low Complexity: 1 Low PT Treatments $Therapeutic Activity: 8-22 mins        Ellamae Sia, PT, DPT Acute Rehabilitation Services Pager [phone] Office [phone]   Willy Eddy 07/22/2018, 9:38 AM

## 2018-07-23 NOTE — Progress Notes (Signed)
Occupational Therapy Treatment Patient Details Name: Dana Holder MRN: [id number] DOB: 10-May-1955 Today's Date: 07/23/2018    History of present illness Pt is a 63 y.o. F with significant PMH of L5-S1 interbody fusion in 2001. Now presents s/p removal of hardware L5-S1, decompressive lumbar laminectomy L4-5, posterior lumbar interbody fusion L4-5   OT comments  Patient progressing well.  Reviewed tub transfers and bathing today, min guard for tub transfers and supervision for bathing seated (simulated).  Pt with good recall of back precautions but requires min cueing to adhere to functionally.  Noted increased time to problem solve through tub transfer technique/home setup, but pt reports "the medication messes me up".  She is agreeable to assist for bathing/shower transfers initially, from spouse (who will be available for the first week).  Will follow.    Follow Up Recommendations  No OT follow up;Supervision - Intermittent    Equipment Recommendations  3 in 1 bedside commode    Recommendations for Other Services      Precautions / Restrictions Precautions Precautions: Back Precaution Comments: verbally reviewed with pt, recall 3/3 independently but requires min cueing to adhere to functionally Required Braces or Orthoses: Spinal Brace Spinal Brace: Lumbar corset;Applied in sitting position(on upon entry) Restrictions Weight Bearing Restrictions: No       Mobility Bed Mobility               General bed mobility comments: OOB upon entry  Transfers Overall transfer level: Needs assistance Equipment used: Rolling walker (2 wheeled) Transfers: Sit to/from Stand Sit to Stand: Supervision         General transfer comment: supervision for safety, cueing for hand placement     Balance Overall balance assessment: Mild deficits observed, not formally tested                                         ADL either performed or assessed with clinical  judgement   ADL Overall ADL's : Needs assistance/impaired     Grooming: Standing;Supervision/safety;Wash/dry hands       Lower Body Bathing: Administrator, [employer];Sit to/from stand Lower Body Bathing Details (indicate cue type and reason): reviewed techniques for bathing seated and brace mgmt      Lower Body Dressing: Sit to/from stand;Supervision/safety;Cueing for safety Lower Body Dressing Details (indicate cue type and reason): good recall of compensatory technique for LB dressing, simulated with supervison  Toilet Transfer: Supervision/safety;Ambulation;RW;BSC;Comfort height toilet       Tub/ Shower Transfer: Tub transfer;Min guard;Shower seat;Ambulation;Rolling walker Tub/Shower Transfer Details (indicate cue type and reason): reviewed techniques with tub transfers, pt preference to step over threshold sideways using UB support; min guard for safety to [facility], agreeable to have spouse assist the first few times  Functional mobility during ADLs: Supervision/safety;Rolling walker       Vision       Perception     Praxis      Cognition Arousal/Alertness: Awake/alert Behavior During Therapy: WFL for tasks assessed/performed Overall Cognitive Status: Within Functional Limits for tasks assessed                                 General Comments: increased time to problem solve through bathroom setup, pt reports pain medication effect        Exercises     Shoulder Instructions  General Comments      Pertinent Vitals/ Pain       Pain Assessment: Faces Faces Pain Scale: Hurts little more Pain Location: incisional- back Pain Descriptors / Indicators: Operative site guarding Pain Intervention(s): Monitored during session;Premedicated before session;Repositioned  Home Living                                          Prior Functioning/Environment              Frequency  Min 2X/week        Progress Toward Goals  OT  Goals(current goals can now be found in the care plan section)  Progress towards OT goals: Progressing toward goals  Acute Rehab OT Goals Patient Stated Goal: "go home tomorrow." OT Goal Formulation: With patient Time For Goal Achievement: 08/05/18 Potential to Achieve Goals: Good  Plan Discharge plan remains appropriate;Frequency remains appropriate    Co-evaluation                 AM-PAC OT "6 Clicks" Daily Activity     Outcome Measure   Help from another person eating meals?: None Help from another person taking care of personal grooming?: None Help from another person toileting, which includes using toliet, bedpan, or urinal?: None Help from another person bathing (including washing, rinsing, drying)?: None Help from another person to put on and taking off regular upper body clothing?: None Help from another person to put on and taking off regular lower body clothing?: None 6 Click Score: 24    End of Session Equipment Utilized During Treatment: Rolling walker;Back brace  OT Visit Diagnosis: Other abnormalities of gait and mobility (R26.89);Pain Pain - part of body: (back-incisional)   Activity Tolerance Patient tolerated treatment well   Patient Left in bed;with call bell/phone within reach;with bed alarm set   Nurse Communication Mobility status        Time: 1610-9604 OT Time Calculation (min): 25 min  Charges: OT General Charges $OT Visit: 1 Visit OT Treatments $Self Care/Home Management : 23-37 mins  Delight Stare, [place] Pager [phone] Office [phone]    Delight Stare 07/23/2018, 9:23 AM

## 2018-07-23 NOTE — Progress Notes (Signed)
Physical Therapy Treatment Patient Details Name: Dana Holder MRN: [id number] DOB: March 08, 1955 Today's Date: 07/23/2018    History of Present Illness Pt is a 63 y.o. F with significant PMH of L5-S1 interbody fusion in 2001. Now presents s/p removal of hardware L5-S1, decompressive lumbar laminectomy L4-5, posterior lumbar interbody fusion L4-5    PT Comments    Patient progressing well towards PT goals. Tolerated stair training today with min guard assist for balance/safety. Educated pt on how to have spouse assist with stair training to enter home as pt does not have a rail. Pt verbalized and demonstrated technique. Able to verbally recall 3/3 precautions however needs cues to adhere to them during functional mobility.  Encouraged walking with nursing later today. Will follow.   Follow Up Recommendations  No PT follow up     Equipment Recommendations  Rolling walker with 5" wheels;3in1 (PT)    Recommendations for Other Services       Precautions / Restrictions Precautions Precautions: Back Precaution Booklet Issued: Yes (comment) Precaution Comments: Recalled 3/3 precautions Required Braces or Orthoses: Spinal Brace Spinal Brace: Lumbar corset;Applied in sitting position Restrictions Weight Bearing Restrictions: No    Mobility  Bed Mobility               General bed mobility comments: OOB upon entry  Transfers Overall transfer level: Needs assistance Equipment used: Rolling walker (2 wheeled) Transfers: Sit to/from Stand Sit to Stand: Supervision         General transfer comment: supervision for safety, cueing for hand placement as pt wanting to pull up on RW.  Ambulation/Gait Ambulation/Gait assistance: Supervision Gait Distance (Feet): 300 Feet Assistive device: Rolling walker (2 wheeled) Gait Pattern/deviations: Step-through pattern;Decreased stride length Gait velocity: decr   General Gait Details: Slow, mostly steady gait with RW for support;  cues for upright.   Stairs Stairs: Yes Stairs assistance: Min guard Stair Management: One rail Right;Forwards;Step to pattern Number of Stairs: 2 General stair comments: Cues for technique and safety.   Wheelchair Mobility    Modified Rankin (Stroke Patients Only)       Balance Overall balance assessment: Mild deficits observed, not formally tested                                          Cognition Arousal/Alertness: Awake/alert Behavior During Therapy: WFL for tasks assessed/performed Overall Cognitive Status: Within Functional Limits for tasks assessed                                 General Comments: increased time to problem solve through bathroom setup, pt reports pain medication effect      Exercises      General Comments        Pertinent Vitals/Pain Pain Assessment: 0-10 Pain Score: 6  Faces Pain Scale: Hurts little more Pain Location: incisional- back Pain Descriptors / Indicators: Operative site guarding;Sore Pain Intervention(s): Repositioned;Monitored during session    Home Living                      Prior Function            PT Goals (current goals can now be found in the care plan section) Acute Rehab PT Goals Patient Stated Goal: "go home tomorrow." Progress towards PT goals: Progressing toward goals  Frequency    Min 5X/week      PT Plan Current plan remains appropriate    Co-evaluation              AM-PAC PT "6 Clicks" Mobility   Outcome Measure  Help needed turning from your back to your side while in a flat bed without using bedrails?: None Help needed moving from lying on your back to sitting on the side of a flat bed without using bedrails?: None Help needed moving to and from a bed to a chair (including a wheelchair)?: None Help needed standing up from a chair using your arms (e.g., wheelchair or bedside chair)?: None Help needed to walk in hospital room?: None Help  needed climbing 3-5 steps with a railing? : A Little 6 Click Score: 23    End of Session Equipment Utilized During Treatment: Back brace Activity Tolerance: Patient tolerated treatment well Patient left: in chair;with call bell/phone within reach Nurse Communication: Mobility status PT Visit Diagnosis: Pain;Difficulty in walking, not elsewhere classified (R26.2) Pain - part of body: (back)     Time: 9798-9211 PT Time Calculation (min) (ACUTE ONLY): 17 min  Charges:  $Gait Training: 8-22 mins                     Wray Kearns, PT, DPT Acute Rehabilitation Services Pager [phone] Office [phone]       Marguarite Arbour A Sabra Heck 07/23/2018, 10:29 AM

## 2018-07-23 NOTE — Progress Notes (Signed)
Overall stable.  Patient still complains of quite a bit of back pain.  She is having difficulty mobilizing secondary to pain.  She is having no lower extremity symptoms.  She is having no numbness paresthesias or weakness.  Afebrile.  Vital signs are stable.  Awake and alert.  Drain output moderate.  Motor and sensory function stable.  Abdomen soft.  Progressing slowly following lumbar decompression and fusion surgery.  Continue efforts at mobilization.  Probable discharge home tomorrow.

## 2018-07-23 NOTE — Social Work (Signed)
Spoke with charge Public affairs consultant regarding pt recommendations. MD-please place orders for 3 in 1 and rolling walker.  Westley Hummer, MSW, [place] Work [phone]

## 2018-07-24 NOTE — Discharge Summary (Signed)
  Physician Discharge Summary  Patient ID: Dana Holder MRN: [id number] DOB/AGE: 1955-09-25 63 y.o. Estimated body mass index is 39.71 kg/m as calculated from the following:   Height as of 07/07/18: 5\' 6"  (1.676 m).   Weight as of this encounter: 111.6 kg.   Admit date: 07/21/2018 Discharge date: 07/24/2018  Admission Diagnoses: Spinal stenosis L4-5  Discharge Diagnoses: Same Active Problems:   Spinal stenosis at L4-L5 level   Discharged Condition: good  Hospital Course: Patient is admitted to hospital underwent decompressive laminectomy and interbody fusion at L4-5 postop the patient did very well and covering the floor on the floor was ambulating and voiding spontaneously significant improvement preoperative radicular pain still had a fair amount of back pain but patient has been cleared by therapy will be discharged with a rolling walker and pain medication and schedule follow-up in 1 to 2 weeks.  Consults: Significant Diagnostic Studies: Treatments: Posterior lumbar interbody fusion L4-5 Discharge Exam: Blood pressure 103/68, pulse 77, temperature 98.3 F (36.8 C), temperature source Oral, resp. rate 18, weight 111.6 kg, SpO2 100 %. Strength 5-5 wound clean dry and intact  Disposition: Home  Discharge Instructions    DME Bedside commode   Complete by: As directed    Patient needs a bedside commode to treat with the following condition: Spinal stenosis at L4-L5 level   For home use only DME 4 wheeled rolling walker with seat   Complete by: As directed    Patient needs a walker to treat with the following condition: Spinal stenosis at L4-L5 level     Allergies as of 07/24/2018   No Known Allergies     Medication List    TAKE these medications   gabapentin 600 MG tablet Commonly known as: NEURONTIN Take 600 mg by mouth See admin instructions. Take 600 mg twice daily, may take a third 600 mg dose as needed for pain   HYDROcodone-acetaminophen 10-325 MG  tablet Commonly known as: NORCO Take 1-2 tablets by mouth every 6 (six) hours as needed for moderate pain.   levothyroxine 88 MCG tablet Commonly known as: SYNTHROID Take 88 mcg by mouth daily before breakfast.   naproxen sodium 220 MG tablet Commonly known as: ALEVE Take 220 mg by mouth daily as needed (pain).            Durable Medical Equipment  (From admission, onward)         Start     Ordered   07/24/18 0000  For home use only DME 4 wheeled rolling walker with seat    Question:  Patient needs a walker to treat with the following condition  Answer:  Spinal stenosis at L4-L5 level   07/24/18 1024   07/24/18 0000  DME Bedside commode    Question:  Patient needs a bedside commode to treat with the following condition  Answer:  Spinal stenosis at L4-L5 level   07/24/18 1024   07/23/18 1153  For home use only DME 3 n 1  Once     07/23/18 1153   07/23/18 1153  For home use only DME Walker rolling  Once    Question:  Patient needs a walker to treat with the following condition  Answer:  History of back surgery   07/23/18 1153           Signed: Rodney Wigger P 07/24/2018, 10:25 AM

## 2018-07-24 NOTE — Progress Notes (Signed)
Occupational Therapy Treatment Patient Details Name: Dana Holder MRN: [id number] DOB: Sep 18, 1955 Today's Date: 07/24/2018    History of present illness Pt is a 63 y.o. F with significant PMH of L5-S1 interbody fusion in 2001. Now presents s/p removal of hardware L5-S1, decompressive lumbar laminectomy L4-5, posterior lumbar interbody fusion L4-5   OT comments  Pt presents supine in bed agreeable to OT treatment session. Pt progressing towards OT goals, though continues to require min cues for safety and adhering to back precautions during functional tasks as pt somewhat impulsive with mobility/tasks today. Pt performing LB dressing with minguard assist, UB ADL, toileting, and standing grooming ADL with supervision throughout. Pt completing functional mobility using RW with supervision throughout. Further reviewed back precautions, safety and compensatory strategies for performing ADL and functional transfers with pt verbalizing understanding. Feel POC remains appropriate at this time. Will continue to follow acutely.    Follow Up Recommendations  No OT follow up;Supervision - Intermittent    Equipment Recommendations  3 in 1 bedside commode          Precautions / Restrictions Precautions Precautions: Back Precaution Booklet Issued: Yes (comment) Precaution Comments: Recalled 3/3 precautions Required Braces or Orthoses: Spinal Brace Spinal Brace: Lumbar corset;Applied in sitting position Restrictions Weight Bearing Restrictions: No       Mobility Bed Mobility Overal bed mobility: Needs Assistance Bed Mobility: Rolling;Sidelying to Sit;Sit to Sidelying Rolling: Supervision Sidelying to sit: Min assist     Sit to sidelying: Min guard General bed mobility comments: increased time/effort; HOB flat and rail down to simulate home environment; pt requires some assist for trunk elevation; close minguard for returning to sidelying/supine for safety; pt requires cues to initiate log  roll technique as she initially attempts to transition supine>sitting without log roll   Transfers Overall transfer level: Needs assistance Equipment used: Rolling walker (2 wheeled) Transfers: Sit to/from Stand Sit to Stand: Supervision         General transfer comment: supervision for safety, cueing for hand placement as pt wanting to pull up on RW.    Balance Overall balance assessment: Mild deficits observed, not formally tested                                         ADL either performed or assessed with clinical judgement   ADL Overall ADL's : Needs assistance/impaired     Grooming: Wash/dry hands;Min guard;Supervision/safety;Standing           Upper Body Dressing : Set up;Supervision/safety;Sitting Upper Body Dressing Details (indicate cue type and reason): VCs to don overhead shirt in sitting vs standing for increased safety; supervision when donning bra to ensure safety with incision site Lower Body Dressing: Min guard;Supervision/safety;Sit to/from stand;Cueing for compensatory techniques;Cueing for back precautions Lower Body Dressing Details (indicate cue type and reason): donning pants seated EOB Toilet Transfer: Supervision/safety;Ambulation;RW;BSC;Comfort height toilet Toilet Transfer Details (indicate cue type and reason): 3:1 over commode Toileting- Clothing Manipulation and Hygiene: Supervision/safety;Sit to/from stand;Sitting/lateral lean Toileting - Clothing Manipulation Details (indicate cue type and reason): pt voiding bladder, performing pericare and clothing management (pants/underwear) with supervision   Tub/Shower Transfer Details (indicate cue type and reason): verbally reviewed tub transfer techniques from previous OT session; pt verbalizes proper technique, reviewed recommendation to have pt's spouse provide supervision initially for increased safety, pt verbalizing understanding/agreement Functional mobility during ADLs:  Supervision/safety;Rolling walker General ADL Comments: pt requires  min VCs for safety and maintaining back precautions during functional tasks                       Cognition Arousal/Alertness: Awake/alert Behavior During Therapy: WFL for tasks assessed/performed Overall Cognitive Status: Within Functional Limits for tasks assessed                                 General Comments: pt somewhat impulsive and does require cues for safety during functional tasks given back precautions; pt tearful end of session expressing stressors related to return to home after spouse returns to work                          Pertinent Vitals/ Pain       Pain Assessment: Faces Pain Score: 6  Faces Pain Scale: Hurts little more Pain Location: incisional- back Pain Descriptors / Indicators: Operative site guarding;Sore Pain Intervention(s): Limited activity within patient's tolerance;Monitored during session;Repositioned  Home Living                                          Prior Functioning/Environment              Frequency  Min 2X/week        Progress Toward Goals  OT Goals(current goals can now be found in the care plan section)  Progress towards OT goals: Progressing toward goals  Acute Rehab OT Goals Patient Stated Goal: "home today" OT Goal Formulation: With patient Time For Goal Achievement: 08/05/18 Potential to Achieve Goals: Good ADL Goals Pt Will Perform Grooming: with modified independence;standing Pt Will Perform Lower Body Bathing: with modified independence;sit to/from stand;with adaptive equipment Pt Will Perform Lower Body Dressing: with modified independence;sit to/from stand;with adaptive equipment Pt Will Transfer to Toilet: with modified independence;bedside commode;ambulating Pt Will Perform Tub/Shower Transfer: Tub transfer;shower seat;rolling walker;with modified independence;ambulating  Plan Discharge plan  remains appropriate;Frequency remains appropriate    Co-evaluation                 AM-PAC OT "6 Clicks" Daily Activity     Outcome Measure   Help from another person eating meals?: None Help from another person taking care of personal grooming?: None Help from another person toileting, which includes using toliet, bedpan, or urinal?: None Help from another person bathing (including washing, rinsing, drying)?: None Help from another person to put on and taking off regular upper body clothing?: None Help from another person to put on and taking off regular lower body clothing?: None 6 Click Score: 24    End of Session Equipment Utilized During Treatment: Rolling walker  OT Visit Diagnosis: Other abnormalities of gait and mobility (R26.89);Pain Pain - part of body: (back-incisional)   Activity Tolerance Patient tolerated treatment well   Patient Left in bed;with call bell/phone within reach   Nurse Communication Mobility status        Time: 5170-0174 OT Time Calculation (min): 30 min  Charges: OT General Charges $OT Visit: 1 Visit OT Treatments $Self Care/Home Management : 23-37 mins  Lou Cal, [place] Pager [phone] Office [phone]    Raymondo Band 07/24/2018, 9:55 AM

## 2018-07-24 NOTE — Progress Notes (Signed)
Physical Therapy Treatment Patient Details Name: Dana Holder MRN: [id number] DOB: 10-03-1955 Today's Date: 07/24/2018    History of Present Illness Pt is a 63 y.o. F with significant PMH of L5-S1 interbody fusion in 2001. Now presents s/p removal of hardware L5-S1, decompressive lumbar laminectomy L4-5, posterior lumbar interbody fusion L4-5    PT Comments    Patient progressing well towards PT goals. Improved ambulation distance and able to perform bed mobility with HOB flat and no use of rails to simulate home. Able to recall 3/3 back precautions but needs cues to adhere to them during mobility. Tolerated stair training with Min A (HHA to simulate rail) and instructed pt on how her spouse can assist at home. Education re: stopping halfway home to get out of car and walk to decrease stiffness and walking program. Will continue to follow.    Follow Up Recommendations  No PT follow up     Equipment Recommendations  Rolling walker with 5" wheels;3in1 (PT)    Recommendations for Other Services       Precautions / Restrictions Precautions Precautions: Back Precaution Booklet Issued: Yes (comment) Precaution Comments: Recalled 3/3 precautions Required Braces or Orthoses: Spinal Brace Spinal Brace: Lumbar corset;Applied in sitting position Restrictions Weight Bearing Restrictions: No    Mobility  Bed Mobility Overal bed mobility: Needs Assistance Bed Mobility: Rolling;Sidelying to Sit;Sit to Sidelying Rolling: Supervision Sidelying to sit: Supervision     Sit to sidelying: Supervision General bed mobility comments: HOB flat, no use of rail and bed elevated to simulate home. Cues for log roll technique.  Transfers Overall transfer level: Needs assistance Equipment used: Rolling walker (2 wheeled) Transfers: Sit to/from Stand Sit to Stand: Supervision         General transfer comment: supervision for safety, cueing for hand placement as pt wanting to pull up on  RW.  Ambulation/Gait Ambulation/Gait assistance: Supervision Gait Distance (Feet): 350 Feet Assistive device: Rolling walker (2 wheeled) Gait Pattern/deviations: Step-through pattern;Decreased stride length Gait velocity: decr   General Gait Details: Slow, mostly steady gait with RW for support; cues for upright.2/4 DOE.   Stairs Stairs: Yes Stairs assistance: Min assist Stair Management: One rail Right;Forwards;Step to pattern Number of Stairs: 3 General stair comments: HHA to simulate rail as pt does not have rail at home.   Wheelchair Mobility    Modified Rankin (Stroke Patients Only)       Balance Overall balance assessment: Mild deficits observed, not formally tested                                          Cognition Arousal/Alertness: Awake/alert Behavior During Therapy: WFL for tasks assessed/performed Overall Cognitive Status: Within Functional Limits for tasks assessed                                        Exercises      General Comments        Pertinent Vitals/Pain Pain Assessment: 0-10 Pain Score: 6  Pain Location: incisional- back Pain Descriptors / Indicators: Operative site guarding;Sore Pain Intervention(s): Repositioned;Monitored during session    Home Living                      Prior Function  PT Goals (current goals can now be found in the care plan section) Progress towards PT goals: Progressing toward goals    Frequency    Min 5X/week      PT Plan Current plan remains appropriate    Co-evaluation              AM-PAC PT "6 Clicks" Mobility   Outcome Measure  Help needed turning from your back to your side while in a flat bed without using bedrails?: None Help needed moving from lying on your back to sitting on the side of a flat bed without using bedrails?: None Help needed moving to and from a bed to a chair (including a wheelchair)?: None Help needed  standing up from a chair using your arms (e.g., wheelchair or bedside chair)?: None Help needed to walk in hospital room?: None Help needed climbing 3-5 steps with a railing? : A Little 6 Click Score: 23    End of Session Equipment Utilized During Treatment: Back brace Activity Tolerance: Patient tolerated treatment well Patient left: in chair;with call bell/phone within reach Nurse Communication: Mobility status PT Visit Diagnosis: Pain;Difficulty in walking, not elsewhere classified (R26.2) Pain - part of body: (back)     Time: 1696-7893 PT Time Calculation (min) (ACUTE ONLY): 20 min  Charges:  $Gait Training: 8-22 mins                     Wray Kearns, PT, DPT Acute Rehabilitation Services Pager [phone] Office [place] 07/24/2018, 8:01 AM

## 2018-07-24 NOTE — TOC Transition Note (Addendum)
Transition of Care [facility]) - CM/SW Discharge Note   Patient Details  Name: BEVIN DAS MRN: [id number] Date of Birth: [birth year]  Transition [facility] CM/SW Contact:  Alexander [place], [place] Phone Number: [phone] 07/24/2018, 11:36 AM   Clinical Narrative:    Pt ordered a rolling walker and 3 in 1 bedside commode. Bedside RN aware of order; pt plan for return home with husband.  Pt bedside commode on back order; pt aware and will pick one up at store. Zack with [employer] will bring by what pt needs to bring to [employer] store and will deliver rolling walker before dc. Pt aware and will get 3-in-1 from store at dc.     Final next level of care: Home/Self Care Barriers to Discharge: Equipment Delay   Patient Goals and CMS Choice Choice offered to / list presented to : Patient  Discharge Placement   Discharge Plan and Services        DME Arranged: 3-N-1, Walker rolling DME Agency: [employer] Date DME Agency Contacted: 07/24/18 Time DME Agency Contacted: [id number] Representative spoke with at DME Agency: [facility] ([place]) Interventions     Readmission Risk Interventions No flowsheet data found.

## 2018-10-03 ENCOUNTER — Encounter (HOSPITAL_COMMUNITY): Payer: Self-pay | Admitting: Neurosurgery

## 2019-03-05 ENCOUNTER — Other Ambulatory Visit (HOSPITAL_COMMUNITY): Payer: Self-pay | Admitting: Internal Medicine

## 2019-03-05 DIAGNOSIS — Z1231 Encounter for screening mammogram for malignant neoplasm of breast: Secondary | ICD-10-CM

## 2019-04-11 ENCOUNTER — Ambulatory Visit (HOSPITAL_COMMUNITY): Payer: Commercial Managed Care - PPO

## 2019-04-25 ENCOUNTER — Ambulatory Visit (HOSPITAL_COMMUNITY): Payer: Commercial Managed Care - PPO

## 2019-05-10 ENCOUNTER — Ambulatory Visit (HOSPITAL_BASED_OUTPATIENT_CLINIC_OR_DEPARTMENT_OTHER): Payer: Commercial Managed Care - PPO | Admitting: Genetic Counselor

## 2019-05-10 DIAGNOSIS — Z8041 Family history of malignant neoplasm of ovary: Secondary | ICD-10-CM | POA: Diagnosis not present

## 2019-05-10 DIAGNOSIS — Z800 Family history of malignant neoplasm of digestive organs: Secondary | ICD-10-CM

## 2019-05-10 DIAGNOSIS — Z808 Family history of malignant neoplasm of other organs or systems: Secondary | ICD-10-CM

## 2019-05-10 DIAGNOSIS — Z8042 Family history of malignant neoplasm of prostate: Secondary | ICD-10-CM

## 2019-05-10 DIAGNOSIS — Z806 Family history of leukemia: Secondary | ICD-10-CM

## 2019-05-10 DIAGNOSIS — Z801 Family history of malignant neoplasm of trachea, bronchus and lung: Secondary | ICD-10-CM | POA: Diagnosis not present

## 2019-05-10 DIAGNOSIS — Z8051 Family history of malignant neoplasm of kidney: Secondary | ICD-10-CM

## 2019-05-11 ENCOUNTER — Telehealth: Payer: Self-pay | Admitting: Genetic Counselor

## 2019-05-11 NOTE — Telephone Encounter (Signed)
Called regarding her insurance information for genetic testing. She will send a copy of her most recent insurance card. We will reach out once we have the out of pocket estimate for genetic testing and she can decide at that time if she would like to move forward with genetic testing.

## 2019-05-14 ENCOUNTER — Encounter (HOSPITAL_COMMUNITY): Payer: Self-pay | Admitting: Genetic Counselor

## 2019-05-14 DIAGNOSIS — Z806 Family history of leukemia: Secondary | ICD-10-CM | POA: Insufficient documentation

## 2019-05-14 DIAGNOSIS — Z8041 Family history of malignant neoplasm of ovary: Secondary | ICD-10-CM | POA: Insufficient documentation

## 2019-05-14 DIAGNOSIS — Z800 Family history of malignant neoplasm of digestive organs: Secondary | ICD-10-CM | POA: Insufficient documentation

## 2019-05-14 DIAGNOSIS — Z8042 Family history of malignant neoplasm of prostate: Secondary | ICD-10-CM | POA: Insufficient documentation

## 2019-05-14 DIAGNOSIS — Z808 Family history of malignant neoplasm of other organs or systems: Secondary | ICD-10-CM | POA: Insufficient documentation

## 2019-05-14 DIAGNOSIS — Z8051 Family history of malignant neoplasm of kidney: Secondary | ICD-10-CM | POA: Insufficient documentation

## 2019-05-14 DIAGNOSIS — Z801 Family history of malignant neoplasm of trachea, bronchus and lung: Secondary | ICD-10-CM | POA: Insufficient documentation

## 2019-05-14 NOTE — Progress Notes (Signed)
REFERRING PROVIDER: No referring provider defined for this encounter.  PRIMARY PROVIDER:  Redmond School, MD  PRIMARY REASON FOR VISIT:  1. Family history of pancreatic cancer   2. Family history of ovarian cancer   3. Family history of leukemia   4. Family history of lung cancer   5. Family history of throat cancer   6. Family history of brain cancer   7. Family history of stomach cancer   8. Family history of colon cancer   9. Family history of kidney cancer   10. Family history of prostate cancer      I connected with Dana Holder on 05/10/2019 at 2:00 pm EDT by video conference and verified that I am speaking with the correct person using two identifiers.   Patient location: [facility] Provider location: [facility] office  HISTORY OF PRESENT ILLNESS:   Dana Holder, a 64 y.o. female, was seen for a [facility] cancer genetics consultation, accompanied by her brother Dana Holder, due to a family history of cancer.  Dana Holder presents to clinic today to discuss the possibility of a hereditary predisposition to cancer, genetic testing, and to further clarify her future cancer risks, as well as potential cancer risks for family members.   Dana Holder does not have a personal history of cancer.     Past Medical History:  Diagnosis Date  . Family history of brain cancer   . Family history of colon cancer   . Family history of kidney cancer   . Family history of leukemia   . Family history of lung cancer   . Family history of ovarian cancer   . Family history of pancreatic cancer   . Family history of prostate cancer   . Family history of stomach cancer   . Family history of throat cancer   . Gout   . Hypothyroidism     Past Surgical History:  Procedure Laterality Date  . BACK SURGERY  2002   lumbar disc  . COLONOSCOPY N/A 12/08/2015   Procedure: COLONOSCOPY;  Surgeon: Danie Binder, MD;  Location: [facility];  Service: Endoscopy;  Laterality: N/A;   9:15 AM  . TUBAL LIGATION      Social History   Socioeconomic History  . Marital status: Married    Spouse name: Not on file  . Number of children: Not on file  . Years of education: Not on file  . Highest education level: Not on file  Occupational History  . Not on file  Tobacco Use  . Smoking status: Former Smoker    Packs/day: 0.50    Years: 30.00    Pack years: 15.00    Types: Cigarettes    Quit date: 06/21/2002    Years since quitting: 16.9  . Smokeless tobacco: Never Used  Substance and Sexual Activity  . Alcohol use: No  . Drug use: No  . Sexual activity: Yes    Birth control/protection: Post-menopausal  Other Topics Concern  . Not on file  Social History Narrative  . Not on file   Social Determinants of Health   Financial Resource Strain:   . Difficulty of Paying Living Expenses:   Food Insecurity:   . Worried About Charity fundraiser in the Last Year:   . Arboriculturist in the Last Year:   Transportation Needs:   . Film/video editor (Medical):   Marland Kitchen Lack of Transportation (Non-Medical):   Physical Activity:   . Days of  Exercise per Week:   . Minutes of Exercise per Session:   Stress:   . Feeling of Stress :   Social Connections:   . Frequency of Communication with Friends and Family:   . Frequency of Social Gatherings with Friends and Family:   . Attends Religious Services:   . Active Member of Clubs or Organizations:   . Attends Archivist Meetings:   Marland Kitchen Marital Status:      FAMILY HISTORY:  We obtained a detailed, 4-generation family history.  Significant diagnoses are listed below: Family History  Problem Relation Age of Onset  . Acute myelogenous leukemia Son   . Throat cancer Father 30  . Ovarian cancer Sister 79  . Lung cancer Brother 49       non-small cell  . Brain cancer Maternal Aunt        dx. in her 107s  . Lung cancer Maternal Uncle        dx. in his 50s or 47s, smoker  . Heart attack Maternal Grandmother   .  Pancreatic cancer Brother 91  . Stomach cancer Maternal Aunt        dx. in her 80s  . Colon cancer Cousin        dx. >50 - maternal cousin  . Kidney cancer Cousin        dx. in his 10s - maternal cousin  . Prostate cancer Cousin 33       maternal cousin  . Anesthesia problems Neg Hx   . Hypotension Neg Hx   . Malignant hyperthermia Neg Hx   . Pseudochol deficiency Neg Hx    Dana Holder has one daughter and one son. Her son died from leukemia (AML) at the age of 45. She has two sisters and two brothers. One sister died at the age of 49 from ovarian cancer that was diagnosed at 56. One brother died at the age of 22 from non-small cell lung cancer that had been diagnosed at age 66. This brother was a smoker. Another brother, Dana Holder, was recently diagnosed with pancreatic cancer at the age of 7.    Dana Holder's mother died at the age of 59 and did not have cancer. Dana Holder had three maternal aunts and four or five maternal uncles. One aunt died from brain cancer in her 68s. Another aunt was diagnosed with stomach cancer in her 73s and later died from being shot. One uncle died in his 3s or 68s from lung cancer and was a smoker. Dana Holder has three maternal cousins who had cancer - one had colon cancer diagnosed when he was older than 63, a second had kidney cancer diagnosed in his 60s, and a third had prostate cancer diagnosed at the age of 60. Dana Holder's maternal grandmother died in her 39s from a heart attack, and she is unsure how old her maternal grandfather was when he died. There are no other known diagnoses of cancer on the maternal side of the family.  Dana Holder father died at the age of 71 from throat cancer and was a smoker. Her father had a twin brother who has not had cancer to her knowledge. She does not know how old her paternal grandparents were when they died, or if there are others on the paternal side of the family who have had cancer.  Dana Holder is aware  of previous family history of genetic testing for hereditary cancer risks - her brother, Dana Holder, is currently undergoing genetic  testing of the [facility] Multi-Cancer Panel, although this results are not yet available. Her maternal ancestors are of [place] and European descent, and paternal ancestors are of [place] descent. There is no reported Ashkenazi Jewish ancestry. There is no known consanguinity.   GENETIC COUNSELING ASSESSMENT: Dana Holder is a 64 y.o. female with a family history of cancer which is somewhat suggestive of a hereditary cancer syndrome and predisposition to cancer. We, therefore, discussed and recommended the following at today's visit.   DISCUSSION: We discussed that approximately 5-10% of cancer is hereditary. Most cases of pancreatic and ovarian cancer are associated with the BRCA1 and BRCA2 genes.  There are other genes that can be associated with hereditary pancreatic and/or ovarian cancer syndromes.  These include the Lynch syndrome genes, CDKN2A, APC, ATM, etc.  We discussed that testing is beneficial for several reasons, including knowing about other cancer risks, identifying potential screening and risk-reduction options that may be appropriate, and to understand if other family members could be at risk for cancer and allow them to undergo genetic testing.  We reviewed the characteristics, features and inheritance patterns of hereditary cancer syndromes. We also discussed genetic testing, including the appropriate family members to test, the process of testing, insurance coverage, genetic discrimination, and turn-around-time for results. We discussed the implications of a negative, positive and/or variant of uncertain significant result. We recommended Dana Holder pursue genetic testing for the [employer].   The Multi-Cancer Panel offered by [facility] includes sequencing and/or deletion duplication testing of the following 85 genes: AIP, ALK, APC, ATM,  AXIN2,BAP1,  BARD1, BLM, BMPR1A, BRCA1, BRCA2, BRIP1, CASR, CDC73, CDH1, CDK4, CDKN1B, CDKN1C, CDKN2A (p14ARF), CDKN2A (p16INK4a), CEBPA, CHEK2, CTNNA1, DICER1, DIS3L2, EGFR (c.2369C>T, p.Thr790Met variant only), EPCAM (Deletion/duplication testing only), FH, FLCN, GATA2, GPC3, GREM1 (Promoter region deletion/duplication testing only), HOXB13 (c.251G>A, p.Gly84Glu), HRAS, KIT, MAX, MEN1, MET, MITF (c.952G>A, p.Glu318Lys variant only), MLH1, MSH2, MSH3, MSH6, MUTYH, NBN, NF1, NF2, NTHL1, PALB2, PDGFRA, PHOX2B, PMS2, POLD1, POLE, POT1, PRKAR1A, PTCH1, PTEN, RAD50, RAD51C, RAD51D, RB1, RECQL4, RET, RNF43, RUNX1, SDHAF2, SDHA (sequence changes only), SDHB, SDHC, SDHD, SMAD4, SMARCA4, SMARCB1, SMARCE1, STK11, SUFU, TERC, TERT, TMEM127, TP53, TSC1, TSC2, VHL, WRN and WT1.   Based on Dana Holder's family history of cancer, she meets medical criteria for genetic testing. Despite that she meets criteria, she may still have an out of pocket cost. We discussed that if her out of pocket cost for testing is over $100, the laboratory will call and confirm whether she wants to proceed with testing.  If the out of pocket cost of testing is less than $100 she will be billed by the genetic testing laboratory.  We also discussed the option to perform a pre-test benefits investigation through the genetic testing laboratory to better estimate what her out of pocket cost may be prior to ordering genetic testing. Dana Holder is interested in having a benefits investigation prior to ordering testing.  We discussed that some people do not want to undergo genetic testing due to fear of genetic discrimination.  A federal law called the Genetic Information Non-Discrimination Act (GINA) of 2008 helps protect individuals against genetic discrimination based on their genetic test results.  It impacts both health insurance and employment.  With health insurance, it protects against increased premiums, being kicked off insurance or being  forced to take a test in order to be insured.  For employment it protects against hiring, firing and promoting decisions based on genetic test results.  Health status due to  a cancer diagnosis is not protected under GINA.  Additionally, life, disability, and long-term care insurance is not protected under GINA.   PLAN:  Once we obtain Dana Holder's most recent insurance information, we will submit a benefits investigation through [employer] to estimate her out-of-pocket cost for genetic testing. We will notify Dana Holder via telephone once we receive this estimate, and she will decide whether she would like to proceed with genetic testing at that time. In the meantime, we recommend Dana Holder continue to follow the cancer screening guidelines given by her primary healthcare provider.  Dana Holder's questions were answered to her satisfaction today. Our contact information was provided should additional questions or concerns arise. Thank you for the referral and allowing [place] to share in the care of your patient.   Clint Guy, [place], [facility] Licensed, Certified Dispensing [email or web address] Phone: [phone]  The patient was seen for a total of 40 minutes in face-to-face genetic counseling.  This patient was discussed with Drs. Magrinat, Lindi Adie and/or Burr Medico who agrees with the above.    _______________________________________________________________________ For Office Staff:  Number of people involved in session: 1 Was an Intern/ student involved with case: no

## 2019-05-17 ENCOUNTER — Telehealth: Payer: Self-pay | Admitting: Genetic Counselor

## 2019-05-17 NOTE — Telephone Encounter (Signed)
Called Ms. Dana Holder regarding her out of pocket estimate for genetic testing. The laboratory [facility]) has estimated that her out of pocket cost would be $0.   Ms. Dana Holder is comfortable proceeding with genetic testing at this time. She will be sent a saliva kit in the mail to complete and send back to [facility] for analysis of the Multi-Cancer Panel. Once her sample is received, results should be available within approximately two-three weeks' time, at which point they will be disclosed by telephone to Ms. Dana Holder, as will any additional recommendations warranted by these results.

## 2019-05-28 ENCOUNTER — Ambulatory Visit (HOSPITAL_COMMUNITY): Payer: Commercial Managed Care - PPO

## 2019-06-26 ENCOUNTER — Telehealth: Payer: Self-pay | Admitting: Genetic Counselor

## 2019-06-26 NOTE — Telephone Encounter (Signed)
Returned Dana Holder's call regarding her saliva kit from the genetic testing laboratory not including a return label to mail it back to the laboratory. This is the second time that the saliva kit has not been able to be used for genetic testing. Offered to have the genetic testing laboratory send another saliva kit with a return label. Dana Holder declined and expressed that she has a lot on her plate right now and does not have the capacity to worry about the genetic testing process anymore. We understand this decision and remain available to coordinate testing at any point in the future. She has our number and will reach out to [place] if she would like to try genetic testing again at a later date.

## 2019-08-09 ENCOUNTER — Ambulatory Visit (HOSPITAL_COMMUNITY): Payer: Commercial Managed Care - PPO

## 2020-07-09 ENCOUNTER — Other Ambulatory Visit: Payer: Self-pay

## 2020-07-09 ENCOUNTER — Ambulatory Visit (HOSPITAL_COMMUNITY)
Admission: RE | Admit: 2020-07-09 | Discharge: 2020-07-09 | Disposition: A | Discharge status: Home / Self Care | Payer: Commercial Managed Care - PPO | Source: Ambulatory Visit | Attending: Internal Medicine | Admitting: Internal Medicine

## 2020-07-09 ENCOUNTER — Other Ambulatory Visit (HOSPITAL_COMMUNITY): Payer: Self-pay | Admitting: Internal Medicine

## 2020-07-09 DIAGNOSIS — R059 Cough, unspecified: Secondary | ICD-10-CM | POA: Diagnosis not present

## 2020-07-11 ENCOUNTER — Other Ambulatory Visit (HOSPITAL_COMMUNITY): Payer: Self-pay | Admitting: Internal Medicine

## 2020-07-11 DIAGNOSIS — Z1231 Encounter for screening mammogram for malignant neoplasm of breast: Secondary | ICD-10-CM

## 2020-07-30 ENCOUNTER — Other Ambulatory Visit: Payer: Self-pay | Admitting: Internal Medicine

## 2020-07-30 ENCOUNTER — Other Ambulatory Visit (HOSPITAL_COMMUNITY): Payer: Self-pay | Admitting: Internal Medicine

## 2020-07-30 DIAGNOSIS — M5412 Radiculopathy, cervical region: Secondary | ICD-10-CM

## 2020-07-31 ENCOUNTER — Other Ambulatory Visit: Payer: Self-pay

## 2020-07-31 ENCOUNTER — Ambulatory Visit (HOSPITAL_COMMUNITY)
Admission: RE | Admit: 2020-07-31 | Discharge: 2020-07-31 | Disposition: A | Discharge status: Home / Self Care | Payer: Commercial Managed Care - PPO | Source: Ambulatory Visit | Attending: Internal Medicine | Admitting: Internal Medicine

## 2020-07-31 DIAGNOSIS — Z1231 Encounter for screening mammogram for malignant neoplasm of breast: Secondary | ICD-10-CM | POA: Diagnosis present

## 2020-08-13 ENCOUNTER — Other Ambulatory Visit: Payer: Self-pay

## 2020-08-13 ENCOUNTER — Ambulatory Visit (HOSPITAL_COMMUNITY)
Admission: RE | Admit: 2020-08-13 | Discharge: 2020-08-13 | Disposition: A | Discharge status: Home / Self Care | Payer: Commercial Managed Care - PPO | Source: Ambulatory Visit | Attending: Internal Medicine | Admitting: Internal Medicine

## 2020-08-13 DIAGNOSIS — M5412 Radiculopathy, cervical region: Secondary | ICD-10-CM | POA: Diagnosis not present

## 2020-11-26 ENCOUNTER — Encounter: Payer: Self-pay | Admitting: *Deleted

## 2021-01-05 ENCOUNTER — Encounter: Payer: Self-pay | Admitting: Gastroenterology

## 2021-04-15 ENCOUNTER — Other Ambulatory Visit: Payer: Self-pay | Admitting: Neurosurgery

## 2021-04-28 NOTE — Pre-Procedure Instructions (Signed)
Surgical Instructions ? ? ? Your procedure is scheduled on Wednesday, April 5th. ? Report to [facility] Main Entrance "A" at 6:30 A.M., then check in with the Admitting office. ? Call this number if you have problems the morning of surgery: ? [phone] ? ? If you have any questions prior to your surgery date call [phone]: Open Monday-Friday 8am-4pm ? ? ? Remember: ? Do not eat or drink after midnight the night before your surgery ?  ? Take these medicines the morning of surgery with A SIP OF WATER:  ?levothyroxine (SYNTHROID)  ? ?As of today, STOP taking any Aspirin (unless otherwise instructed by your surgeon) Aleve, Naproxen, Ibuprofen, Motrin, Advil, Goody's, BC's, all herbal medications, fish oil, and all vitamins. ? ?         ?Do not wear jewelry or makeup ?Do not wear lotions, powders, perfumes, or deodorant. ?Do not shave 48 hours prior to surgery. ?Do not bring valuables to the hospital. ?Do not wear nail polish, gel polish, artificial nails, or any other type of covering on natural nails (fingers and toes) ?If you have artificial nails or gel coating that need to be removed by a nail salon, please have this removed prior to surgery. Artificial nails or gel coating may interfere with anesthesia's ability to adequately monitor your vital signs. ? ?[facility] is not responsible for any belongings or valuables. .  ? ?Do NOT Smoke (Tobacco/Vaping)  24 hours prior to your procedure ? ?If you use a CPAP at night, you may bring your mask for your overnight stay. ?  ?Contacts, glasses, hearing aids, dentures or partials may not be worn into surgery, please bring cases for these belongings ?  ?For patients admitted to the hospital, discharge time will be determined by your treatment team. ?  ?Patients discharged the day of surgery will not be allowed to drive home, and someone needs to stay with them for 24 hours. ? ? ?SURGICAL WAITING ROOM VISITATION ?Patients having surgery or a procedure in a hospital may  have two support people. ?Children under the age of 26 must have an adult with them who is not the patient. ?They may stay in the waiting area during the procedure and may switch out with other visitors. If the patient needs to stay at the hospital during part of their recovery, the visitor guidelines for inpatient rooms apply. ? ?Please refer to the [facility] website for the visitor guidelines for Inpatients (after your surgery is over and you are in a regular room).  ? ? ? ? ? ?Special instructions:   ? ?Oral Hygiene is also important to reduce your risk of infection.  Remember - BRUSH YOUR TEETH THE MORNING OF SURGERY WITH YOUR REGULAR TOOTHPASTE ? ? ?[facility]- Preparing For Surgery ? ?Before surgery, you can play an important role. Because skin is not sterile, your skin needs to be as free of germs as possible. You can reduce the number of germs on your skin by washing with CHG (chlorahexidine gluconate) Soap before surgery.  CHG is an antiseptic cleaner which kills germs and bonds with the skin to continue killing germs even after washing.   ? ? ?Please do not use if you have an allergy to CHG or antibacterial soaps. If your skin becomes reddened/irritated stop using the CHG.  ?Do not shave (including legs and underarms) for at least 48 hours prior to first CHG shower. It is OK to shave your face. ? ?Please follow these instructions carefully. ?  ? ?  Shower the NIGHT BEFORE SURGERY and the MORNING OF SURGERY with CHG Soap.  ? If you chose to wash your hair, wash your hair first as usual with your normal shampoo. After you shampoo, rinse your hair and body thoroughly to remove the shampoo.  Then [employer] and genitals (private parts) with your normal soap and rinse thoroughly to remove soap. ? ?After that Use CHG Soap as you would any other liquid soap. You can apply CHG directly to the skin and wash gently with a scrungie or a clean washcloth.  ? ?Apply the CHG Soap to your body ONLY FROM THE NECK DOWN.  Do  not use on open wounds or open sores. Avoid contact with your eyes, ears, mouth and genitals (private parts). Wash Face and genitals (private parts)  with your normal soap.  ? ?Wash thoroughly, paying special attention to the area where your surgery will be performed. ? ?Thoroughly rinse your body with warm water from the neck down. ? ?DO NOT shower/wash with your normal soap after using and rinsing off the CHG Soap. ? ?Pat yourself dry with a CLEAN TOWEL. ? ?Wear CLEAN PAJAMAS to bed the night before surgery ? ?Place CLEAN SHEETS on your bed the night before your surgery ? ?DO NOT SLEEP WITH PETS. ? ? ?Day of Surgery: ? ?Take a shower with CHG soap. ?Wear Clean/Comfortable clothing the morning of surgery ?Do not apply any deodorants/lotions.   ?Remember to brush your teeth WITH YOUR REGULAR TOOTHPASTE. ? ? ? ?If you received a COVID test during your pre-op visit  it is requested that you wear a mask when out in public, stay away from anyone that may not be feeling well and notify your surgeon if you develop symptoms. If you have been in contact with anyone that has tested positive in the last 10 days please notify you surgeon. ? ?  ?Please read over the following fact sheets that you were given.  ? ?

## 2021-04-29 ENCOUNTER — Other Ambulatory Visit: Payer: Self-pay

## 2021-04-29 ENCOUNTER — Encounter (HOSPITAL_COMMUNITY)
Admission: RE | Admit: 2021-04-29 | Discharge: 2021-04-29 | Disposition: A | Discharge status: Home / Self Care | Payer: Medicare Other | Source: Ambulatory Visit | Attending: Neurosurgery | Admitting: Neurosurgery

## 2021-04-29 ENCOUNTER — Encounter (HOSPITAL_COMMUNITY): Payer: Self-pay

## 2021-04-29 VITALS — BP 123/86 | HR 79 | Temp 98.4°F | Resp 17 | Ht 66.0 in | Wt 233.4 lb

## 2021-04-29 DIAGNOSIS — Z01812 Encounter for preprocedural laboratory examination: Secondary | ICD-10-CM | POA: Diagnosis present

## 2021-04-29 DIAGNOSIS — Z01818 Encounter for other preprocedural examination: Secondary | ICD-10-CM

## 2021-04-29 LAB — TYPE AND SCREEN
ABO/RH(D): B POS
Antibody Screen: NEGATIVE

## 2021-04-29 LAB — CBC
HCT: 41.1 % (ref 36.0–46.0)
Hemoglobin: 14.1 g/dL (ref 12.0–15.0)
MCH: 30.5 pg (ref 26.0–34.0)
MCHC: 34.3 g/dL (ref 30.0–36.0)
MCV: 89 fL (ref 80.0–100.0)
Platelets: 183 10*3/uL (ref 150–400)
RBC: 4.62 MIL/uL (ref 3.87–5.11)
RDW: 13.1 % (ref 11.5–15.5)
WBC: 7.7 10*3/uL (ref 4.0–10.5)
nRBC: 0 % (ref 0.0–0.2)

## 2021-04-29 LAB — SURGICAL PCR SCREEN
MRSA, PCR: NEGATIVE
Staphylococcus aureus: NEGATIVE

## 2021-04-29 NOTE — Progress Notes (Signed)
PCP - Redmond School, MD ?Cardiologist - denies ? ?PPM/ICD - denies ?Device Orders - n/a ?Rep Notified - n/a ? ?Chest x-ray - n/a ?EKG - n/a ?Stress Test - denies ?ECHO - denies ?Cardiac Cath - denies ? ?Sleep Study - denies ?CPAP - n/a ? ?Fasting Blood Sugar - n/a ? ?Blood Thinner Instructions: n/a ? ?Aspirin Instructions: Patient was instructed: As of today, STOP taking any Aspirin (unless otherwise instructed by your surgeon) Aleve, Naproxen, Ibuprofen, Motrin, Advil, Goody's, BC's, all herbal medications, fish oil, and all vitamins.  ? ?ERAS Protcol - n/a ? ?COVID TEST- n/a ? ? ?Anesthesia review: no ? ?Patient denies shortness of breath, fever, cough and chest pain at PAT appointment ? ? ?All instructions explained to the patient, with a verbal understanding of the material. Patient agrees to go over the instructions while at home for a better understanding. Patient also instructed to self quarantine after being tested for COVID-19. The opportunity to ask questions was provided. ?  ?

## 2021-05-04 NOTE — Progress Notes (Deleted)
? ? ?Primary Care Physician:  Redmond School, MD ?Primary Gastroenterologist:  Dr. Abbey Chatters ? ?No chief complaint on file. ? ? ?HPI:   ?Dana Holder is a 66 y.o. female presenting today to discuss scheduling surveillance colonoscopy.  Last colonoscopy November 2017 with a 6 mm sessile serrated polyp without dysplasia, 3 mm polyp resected but not retrieved, moderate diverticulosis in sigmoid, descending, mid transverse colon, small nonbleeding internal hemorrhoids, redundant left colon.  Recommended repeat colonoscopy in 5 years.  ? ?Today:  ? ? ?She does have history of constipation. Prescribed Linzess 145 mcg daily in 2017.  ? ? ? ?Past Medical History:  ?Diagnosis Date  ? Family history of brain cancer   ? Family history of colon cancer   ? Family history of kidney cancer   ? Family history of leukemia   ? Family history of lung cancer   ? Family history of ovarian cancer   ? Family history of pancreatic cancer   ? Family history of prostate cancer   ? Family history of stomach cancer   ? Family history of throat cancer   ? Gout   ? Hypothyroidism   ? ? ?Past Surgical History:  ?Procedure Laterality Date  ? BACK SURGERY  2002  ? lumbar disc  ? COLONOSCOPY N/A 12/08/2015  ? Procedure: COLONOSCOPY;  Surgeon: Danie Binder, MD;  Location: [facility];  Service: Endoscopy;  Laterality: N/A;  9:15 AM  ? TUBAL LIGATION    ? ? ?Current Outpatient Medications  ?Medication Sig Dispense Refill  ? levothyroxine (SYNTHROID) 88 MCG tablet Take 88 mcg by mouth daily before breakfast.    ? naproxen sodium (ALEVE) 220 MG tablet Take 220 mg by mouth daily as needed (pain).    ? ?No current facility-administered medications for this visit.  ? ? ?Allergies as of 05/06/2021  ? (No Known Allergies)  ? ? ?Family History  ?Problem Relation Age of Onset  ? Acute myelogenous leukemia Son   ? Throat cancer Father 20  ? Ovarian cancer Sister 93  ? Lung cancer Brother 20  ?     non-small cell  ? Brain cancer Maternal Aunt   ?     dx.  in her 1s  ? Lung cancer Maternal Uncle   ?     dx. in his 56s or 61s, smoker  ? Heart attack Maternal Grandmother   ? Pancreatic cancer Brother 66  ? Stomach cancer Maternal Aunt   ?     dx. in her 69s  ? Colon cancer Cousin   ?     dx. >50 - maternal cousin  ? Kidney cancer Cousin   ?     dx. in his 46s - maternal cousin  ? Prostate cancer Cousin 68  ?     maternal cousin  ? Anesthesia problems Neg Hx   ? Hypotension Neg Hx   ? Malignant hyperthermia Neg Hx   ? Pseudochol deficiency Neg Hx   ? ? ?Social History  ? ?Socioeconomic History  ? Marital status: Married  ?  Spouse name: Not on file  ? Number of children: Not on file  ? Years of education: Not on file  ? Highest education level: Not on file  ?Occupational History  ? Not on file  ?Tobacco Use  ? Smoking status: Former  ?  Packs/day: 0.50  ?  Years: 30.00  ?  Pack years: 15.00  ?  Types: Cigarettes  ?  Quit date: 06/21/2002  ?  Years since quitting: 18.8  ? Smokeless tobacco: Never  ?Vaping Use  ? Vaping Use: Never used  ?Substance and Sexual Activity  ? Alcohol use: No  ? Drug use: No  ? Sexual activity: Yes  ?  Birth control/protection: Post-menopausal  ?Other Topics Concern  ? Not on file  ?Social History Narrative  ? Not on file  ? ?Social Determinants of Health  ? ?Financial Resource Strain: Not on file  ?Food Insecurity: Not on file  ?Transportation Needs: Not on file  ?Physical Activity: Not on file  ?Stress: Not on file  ?Social Connections: Not on file  ?Intimate Partner Violence: Not on file  ? ? ?Review of Systems: ?Gen: Denies any fever, chills, cold or flulike symptoms, presyncope, syncope. ?CV: Denies chest pain, heart palpitations. ?Resp: Denies shortness of breath or cough. ?GI: See HPI ?GU : Denies urinary burning, urinary frequency, urinary hesitancy ?MS: Denies joint pain. ?Derm: Denies rash. ?Psych: Denies depression, anxiety. ?Heme: See HPI ? ?Physical Exam: ?There were no vitals taken for this visit. ?General:   Alert and oriented.  Pleasant and cooperative. Well-nourished and well-developed.  ?Head:  Normocephalic and atraumatic. ?Eyes:  Without icterus, sclera clear and conjunctiva pink.  ?Ears:  Normal auditory acuity. ?Lungs:  Clear to auscultation bilaterally. No wheezes, rales, or rhonchi. No distress.  ?Heart:  S1, S2 present without murmurs appreciated.  ?Abdomen:  +BS, soft, non-tender and non-distended. No HSM noted. No guarding or rebound. No masses appreciated.  ?Rectal:  Deferred  ?Msk:  Symmetrical without gross deformities. Normal posture. ?Extremities:  Without edema. ?Neurologic:  Alert and  oriented x4;  grossly normal neurologically. ?Skin:  Intact without significant lesions or rashes. ?Psych:  Normal mood and affect. ? ? ? ?Assessment:  ? ? ? ?Plan:  ?*** ? ? ?Aliene Altes, PA-C ?[facility] ?05/06/2021 ?  ?

## 2021-05-06 ENCOUNTER — Encounter (HOSPITAL_COMMUNITY): Payer: Self-pay | Admitting: Neurosurgery

## 2021-05-06 ENCOUNTER — Inpatient Hospital Stay (HOSPITAL_COMMUNITY)
Admission: RE | Admit: 2021-05-06 | Discharge: 2021-05-07 | DRG: 473 | Disposition: A | Discharge status: Home / Self Care | Payer: Medicare Other | Attending: Neurosurgery | Admitting: Neurosurgery

## 2021-05-06 ENCOUNTER — Inpatient Hospital Stay (HOSPITAL_COMMUNITY): Payer: Medicare Other | Admitting: Anesthesiology

## 2021-05-06 ENCOUNTER — Encounter: Payer: Self-pay | Admitting: Internal Medicine

## 2021-05-06 ENCOUNTER — Other Ambulatory Visit: Payer: Self-pay

## 2021-05-06 ENCOUNTER — Inpatient Hospital Stay (HOSPITAL_COMMUNITY): Payer: Medicare Other

## 2021-05-06 ENCOUNTER — Ambulatory Visit: Payer: Commercial Managed Care - PPO | Admitting: Gastroenterology

## 2021-05-06 ENCOUNTER — Inpatient Hospital Stay (HOSPITAL_COMMUNITY)
Admission: RE | Disposition: A | Discharge status: Home / Self Care | Payer: Self-pay | Source: Home / Self Care | Attending: Neurosurgery

## 2021-05-06 DIAGNOSIS — M4722 Other spondylosis with radiculopathy, cervical region: Secondary | ICD-10-CM | POA: Diagnosis present

## 2021-05-06 DIAGNOSIS — Z9851 Tubal ligation status: Secondary | ICD-10-CM | POA: Diagnosis not present

## 2021-05-06 DIAGNOSIS — M4802 Spinal stenosis, cervical region: Secondary | ICD-10-CM

## 2021-05-06 DIAGNOSIS — Z6837 Body mass index (BMI) 37.0-37.9, adult: Secondary | ICD-10-CM

## 2021-05-06 DIAGNOSIS — M779 Enthesopathy, unspecified: Secondary | ICD-10-CM | POA: Diagnosis present

## 2021-05-06 DIAGNOSIS — M2578 Osteophyte, vertebrae: Secondary | ICD-10-CM | POA: Diagnosis present

## 2021-05-06 DIAGNOSIS — Z79890 Hormone replacement therapy (postmenopausal): Secondary | ICD-10-CM | POA: Diagnosis not present

## 2021-05-06 DIAGNOSIS — E039 Hypothyroidism, unspecified: Secondary | ICD-10-CM | POA: Diagnosis present

## 2021-05-06 DIAGNOSIS — Z87891 Personal history of nicotine dependence: Secondary | ICD-10-CM

## 2021-05-06 HISTORY — PX: ANTERIOR CERVICAL DECOMPRESSION/DISCECTOMY FUSION 4 LEVELS: SHX5556

## 2021-05-06 SURGERY — ANTERIOR CERVICAL DECOMPRESSION/DISCECTOMY FUSION 4 LEVELS
Anesthesia: General | Site: Spine Cervical

## 2021-05-06 MED ORDER — ONDANSETRON HCL 4 MG/2ML IJ SOLN: 4.0000 mg | Freq: Once | INTRAMUSCULAR | Status: DC | PRN

## 2021-05-06 MED ORDER — FENTANYL CITRATE (PF) 250 MCG/5ML IJ SOLN
INTRAMUSCULAR | Status: AC
  Filled 2021-05-06: qty 5

## 2021-05-06 MED ORDER — HYDROMORPHONE HCL 1 MG/ML IJ SOLN
INTRAMUSCULAR | Status: DC | PRN
Start: 2021-05-06 — End: 2021-05-06
  Administered 2021-05-06: .5 mg via INTRAVENOUS

## 2021-05-06 MED ORDER — ROCURONIUM BROMIDE 10 MG/ML (PF) SYRINGE
PREFILLED_SYRINGE | INTRAVENOUS | Status: DC | PRN
  Administered 2021-05-06: 30 mg via INTRAVENOUS
  Administered 2021-05-06: 50 mg via INTRAVENOUS
  Administered 2021-05-06: 20 mg via INTRAVENOUS
  Administered 2021-05-06: 30 mg via INTRAVENOUS

## 2021-05-06 MED ORDER — ALUM & MAG HYDROXIDE-SIMETH 200-200-20 MG/5ML PO SUSP: 30.0000 mL | Freq: Four times a day (QID) | ORAL | Status: DC | PRN

## 2021-05-06 MED ORDER — MENTHOL 3 MG MT LOZG: 1.0000 | LOZENGE | OROMUCOSAL | Status: DC | PRN

## 2021-05-06 MED ORDER — PHENYLEPHRINE HCL-NACL 20-0.9 MG/250ML-% IV SOLN
INTRAVENOUS | Status: DC | PRN
  Administered 2021-05-06: 20 ug/min via INTRAVENOUS

## 2021-05-06 MED ORDER — LACTATED RINGERS IV SOLN: INTRAVENOUS | Status: DC | PRN

## 2021-05-06 MED ORDER — FENTANYL CITRATE (PF) 250 MCG/5ML IJ SOLN
INTRAMUSCULAR | Status: DC | PRN
  Administered 2021-05-06: 50 ug via INTRAVENOUS
  Administered 2021-05-06: 100 ug via INTRAVENOUS
  Administered 2021-05-06: 50 ug via INTRAVENOUS
  Administered 2021-05-06: 25 ug via INTRAVENOUS
  Administered 2021-05-06 (×2): 50 ug via INTRAVENOUS
  Administered 2021-05-06: 25 ug via INTRAVENOUS
  Administered 2021-05-06: 50 ug via INTRAVENOUS

## 2021-05-06 MED ORDER — PROPOFOL 10 MG/ML IV BOLUS
INTRAVENOUS | Status: DC | PRN
Start: 2021-05-06 — End: 2021-05-06
  Administered 2021-05-06: 110 mg via INTRAVENOUS

## 2021-05-06 MED ORDER — PROPOFOL 10 MG/ML IV BOLUS
INTRAVENOUS | Status: AC
  Filled 2021-05-06: qty 20

## 2021-05-06 MED ORDER — KETOROLAC TROMETHAMINE 0.5 % OP SOLN
1.0000 [drp] | Freq: Three times a day (TID) | OPHTHALMIC | Status: DC | PRN
  Administered 2021-05-06: 1 [drp] via OPHTHALMIC
  Filled 2021-05-06: qty 5

## 2021-05-06 MED ORDER — CHLORHEXIDINE GLUCONATE 0.12 % MT SOLN
15.0000 mL | Freq: Once | OROMUCOSAL | Status: AC
  Administered 2021-05-06: 15 mL via OROMUCOSAL
  Filled 2021-05-06: qty 15

## 2021-05-06 MED ORDER — FENTANYL CITRATE (PF) 250 MCG/5ML IJ SOLN
INTRAMUSCULAR | Status: AC
Start: 2021-05-06 — End: ?
  Filled 2021-05-06: qty 5

## 2021-05-06 MED ORDER — SODIUM CHLORIDE 0.9 % IV SOLN
250.0000 mL | INTRAVENOUS | Status: DC
  Administered 2021-05-06: 250 mL via INTRAVENOUS

## 2021-05-06 MED ORDER — SODIUM CHLORIDE 0.9% FLUSH: 3.0000 mL | INTRAVENOUS | Status: DC | PRN

## 2021-05-06 MED ORDER — ACETAMINOPHEN 650 MG RE SUPP: 650.0000 mg | RECTAL | Status: DC | PRN

## 2021-05-06 MED ORDER — THROMBIN 20000 UNITS EX SOLR
CUTANEOUS | Status: AC
  Filled 2021-05-06: qty 20000

## 2021-05-06 MED ORDER — CEFAZOLIN SODIUM-DEXTROSE 2-4 GM/100ML-% IV SOLN
2.0000 g | INTRAVENOUS | Status: AC
  Administered 2021-05-06: 2 g via INTRAVENOUS
  Filled 2021-05-06: qty 100

## 2021-05-06 MED ORDER — ACETAMINOPHEN 10 MG/ML IV SOLN
1000.0000 mg | Freq: Once | INTRAVENOUS | Status: DC | PRN
  Administered 2021-05-06: 1000 mg via INTRAVENOUS

## 2021-05-06 MED ORDER — CHLORHEXIDINE GLUCONATE CLOTH 2 % EX PADS: 6.0000 | MEDICATED_PAD | Freq: Once | CUTANEOUS | Status: DC

## 2021-05-06 MED ORDER — OXYCODONE HCL 5 MG/5ML PO SOLN: 5.0000 mg | Freq: Once | ORAL | Status: DC | PRN

## 2021-05-06 MED ORDER — ONDANSETRON HCL 4 MG/2ML IJ SOLN
INTRAMUSCULAR | Status: DC | PRN
  Administered 2021-05-06: 4 mg via INTRAVENOUS

## 2021-05-06 MED ORDER — 0.9 % SODIUM CHLORIDE (POUR BTL) OPTIME
TOPICAL | Status: DC | PRN
  Administered 2021-05-06: 1000 mL

## 2021-05-06 MED ORDER — THROMBIN 5000 UNITS EX SOLR: OROMUCOSAL | Status: DC | PRN

## 2021-05-06 MED ORDER — SUCCINYLCHOLINE CHLORIDE 200 MG/10ML IV SOSY
PREFILLED_SYRINGE | INTRAVENOUS | Status: DC | PRN
  Administered 2021-05-06: 140 mg via INTRAVENOUS

## 2021-05-06 MED ORDER — SUGAMMADEX SODIUM 200 MG/2ML IV SOLN
INTRAVENOUS | Status: DC | PRN
  Administered 2021-05-06: 250 mg via INTRAVENOUS

## 2021-05-06 MED ORDER — HYDROCODONE-ACETAMINOPHEN 7.5-325 MG PO TABS
2.0000 | ORAL_TABLET | ORAL | Status: DC | PRN
  Administered 2021-05-06 – 2021-05-07 (×2): 2 via ORAL
  Filled 2021-05-06 (×3): qty 2

## 2021-05-06 MED ORDER — MIDAZOLAM HCL 5 MG/5ML IJ SOLN
INTRAMUSCULAR | Status: DC | PRN
  Administered 2021-05-06: 2 mg via INTRAVENOUS

## 2021-05-06 MED ORDER — ONDANSETRON HCL 4 MG/2ML IJ SOLN: 4.0000 mg | Freq: Four times a day (QID) | INTRAMUSCULAR | Status: DC | PRN

## 2021-05-06 MED ORDER — LIDOCAINE 2% (20 MG/ML) 5 ML SYRINGE
INTRAMUSCULAR | Status: DC | PRN
Start: 2021-05-06 — End: 2021-05-06
  Administered 2021-05-06: 100 mg via INTRAVENOUS

## 2021-05-06 MED ORDER — DEXAMETHASONE SODIUM PHOSPHATE 10 MG/ML IJ SOLN
INTRAMUSCULAR | Status: DC | PRN
  Administered 2021-05-06: 10 mg via INTRAVENOUS

## 2021-05-06 MED ORDER — ACETAMINOPHEN 325 MG PO TABS: 650.0000 mg | ORAL_TABLET | ORAL | Status: DC | PRN

## 2021-05-06 MED ORDER — ACETAMINOPHEN 10 MG/ML IV SOLN
INTRAVENOUS | Status: AC
  Filled 2021-05-06: qty 100

## 2021-05-06 MED ORDER — THROMBIN 20000 UNITS EX SOLR: CUTANEOUS | Status: DC | PRN

## 2021-05-06 MED ORDER — HYDROXYZINE HCL 50 MG/ML IM SOLN
50.0000 mg | Freq: Four times a day (QID) | INTRAMUSCULAR | Status: DC | PRN
Start: 2021-05-06 — End: 2021-05-07
  Administered 2021-05-06: 50 mg via INTRAMUSCULAR
  Filled 2021-05-06: qty 1

## 2021-05-06 MED ORDER — MIDAZOLAM HCL 2 MG/2ML IJ SOLN
INTRAMUSCULAR | Status: AC
  Filled 2021-05-06: qty 2

## 2021-05-06 MED ORDER — ONDANSETRON HCL 4 MG PO TABS: 4.0000 mg | ORAL_TABLET | Freq: Four times a day (QID) | ORAL | Status: DC | PRN

## 2021-05-06 MED ORDER — ORAL CARE MOUTH RINSE: 15.0000 mL | Freq: Once | OROMUCOSAL | Status: AC

## 2021-05-06 MED ORDER — POLYMYXIN B-TRIMETHOPRIM 10000-0.1 UNIT/ML-% OP SOLN
2.0000 [drp] | Freq: Once | OPHTHALMIC | Status: DC
  Filled 2021-05-06: qty 10

## 2021-05-06 MED ORDER — PANTOPRAZOLE SODIUM 40 MG PO TBEC
40.0000 mg | DELAYED_RELEASE_TABLET | Freq: Every day | ORAL | Status: DC
  Administered 2021-05-06: 40 mg via ORAL
  Filled 2021-05-06: qty 1

## 2021-05-06 MED ORDER — BSS IO SOLN
15.0000 mL | Freq: Once | INTRAOCULAR | Status: AC
  Administered 2021-05-06: 15 mL
  Filled 2021-05-06: qty 15

## 2021-05-06 MED ORDER — CYCLOBENZAPRINE HCL 10 MG PO TABS
10.0000 mg | ORAL_TABLET | Freq: Three times a day (TID) | ORAL | Status: DC | PRN
  Administered 2021-05-06: 10 mg via ORAL
  Filled 2021-05-06 (×2): qty 1

## 2021-05-06 MED ORDER — THROMBIN 5000 UNITS EX SOLR
CUTANEOUS | Status: AC
  Filled 2021-05-06: qty 5000

## 2021-05-06 MED ORDER — SODIUM CHLORIDE 0.9% FLUSH
3.0000 mL | Freq: Two times a day (BID) | INTRAVENOUS | Status: DC
  Administered 2021-05-06 (×2): 3 mL via INTRAVENOUS

## 2021-05-06 MED ORDER — HYDROMORPHONE HCL 1 MG/ML IJ SOLN: 0.2500 mg | INTRAMUSCULAR | Status: DC | PRN

## 2021-05-06 MED ORDER — CEFAZOLIN SODIUM-DEXTROSE 2-4 GM/100ML-% IV SOLN
2.0000 g | Freq: Three times a day (TID) | INTRAVENOUS | Status: AC
  Administered 2021-05-06 – 2021-05-07 (×2): 2 g via INTRAVENOUS
  Filled 2021-05-06 (×2): qty 100

## 2021-05-06 MED ORDER — HYDROMORPHONE HCL 1 MG/ML IJ SOLN
0.5000 mg | INTRAMUSCULAR | Status: DC | PRN
  Administered 2021-05-06: 0.5 mg via INTRAVENOUS
  Filled 2021-05-06: qty 0.5

## 2021-05-06 MED ORDER — GLYCOPYRROLATE PF 0.2 MG/ML IJ SOSY
PREFILLED_SYRINGE | INTRAMUSCULAR | Status: DC | PRN
  Administered 2021-05-06: .1 mg via INTRAVENOUS

## 2021-05-06 MED ORDER — PHENOL 1.4 % MT LIQD: 1.0000 | OROMUCOSAL | Status: DC | PRN

## 2021-05-06 MED ORDER — LACTATED RINGERS IV SOLN: INTRAVENOUS | Status: DC

## 2021-05-06 MED ORDER — PANTOPRAZOLE SODIUM 40 MG IV SOLR: 40.0000 mg | Freq: Every day | INTRAVENOUS | Status: DC

## 2021-05-06 MED ORDER — HYDROMORPHONE HCL 1 MG/ML IJ SOLN
INTRAMUSCULAR | Status: AC
  Filled 2021-05-06: qty 0.5

## 2021-05-06 MED ORDER — OXYCODONE HCL 5 MG PO TABS: 5.0000 mg | ORAL_TABLET | Freq: Once | ORAL | Status: DC | PRN

## 2021-05-06 MED ORDER — LEVOTHYROXINE SODIUM 88 MCG PO TABS
88.0000 ug | ORAL_TABLET | Freq: Every day | ORAL | Status: DC
  Administered 2021-05-07: 88 ug via ORAL
  Filled 2021-05-06: qty 1

## 2021-05-06 SURGICAL SUPPLY — 62 items
BAG COUNTER SPONGE SURGICOUNT (BAG) ×2 IMPLANT
BAND RUBBER #18 3X1/16 STRL (MISCELLANEOUS) ×4 IMPLANT
BASKET BONE COLLECTION (BASKET) ×2 IMPLANT
BENZOIN TINCTURE PRP APPL 2/3 (GAUZE/BANDAGES/DRESSINGS) ×2 IMPLANT
BIT DRILL NEURO 2X3.1 SFT TUCH (MISCELLANEOUS) ×1 IMPLANT
BONE VIVIGEN FORMABLE 1.3CC (Bone Implant) ×4 IMPLANT
BUR MATCHSTICK NEURO 3.0 LAGG (BURR) ×2 IMPLANT
CANISTER SUCT 3000ML PPV (MISCELLANEOUS) ×2 IMPLANT
CARTRIDGE OIL MAESTRO DRILL (MISCELLANEOUS) ×1 IMPLANT
DERMABOND ADVANCED (GAUZE/BANDAGES/DRESSINGS) ×1
DERMABOND ADVANCED .7 DNX12 (GAUZE/BANDAGES/DRESSINGS) IMPLANT
DIFFUSER DRILL AIR PNEUMATIC (MISCELLANEOUS) ×2 IMPLANT
DRAPE C-ARM 42X72 X-RAY (DRAPES) ×4 IMPLANT
DRAPE LAPAROTOMY 100X72 PEDS (DRAPES) ×2 IMPLANT
DRAPE MICROSCOPE LEICA (MISCELLANEOUS) ×2 IMPLANT
DRILL NEURO 2X3.1 SOFT TOUCH (MISCELLANEOUS) ×2
DRSG OPSITE POSTOP 4X6 (GAUZE/BANDAGES/DRESSINGS) ×1 IMPLANT
DURAPREP 6ML APPLICATOR 50/CS (WOUND CARE) ×2 IMPLANT
ELECT COATED BLADE 2.86 ST (ELECTRODE) ×2 IMPLANT
ELECT REM PT RETURN 9FT ADLT (ELECTROSURGICAL) ×2
ELECTRODE REM PT RTRN 9FT ADLT (ELECTROSURGICAL) ×1 IMPLANT
GAUZE 4X4 16PLY ~~LOC~~+RFID DBL (SPONGE) ×1 IMPLANT
GAUZE SPONGE 4X4 12PLY STRL (GAUZE/BANDAGES/DRESSINGS) ×1 IMPLANT
GLOVE SURG ENC MOIS LTX SZ7 (GLOVE) ×2 IMPLANT
GLOVE SURG ENC MOIS LTX SZ8 (GLOVE) ×2 IMPLANT
GLOVE SURG UNDER POLY LF SZ7 (GLOVE) ×2 IMPLANT
GLOVE SURG UNDER POLY LF SZ8.5 (GLOVE) ×2 IMPLANT
GOWN STRL REUS W/ TWL LRG LVL3 (GOWN DISPOSABLE) ×1 IMPLANT
GOWN STRL REUS W/ TWL XL LVL3 (GOWN DISPOSABLE) ×1 IMPLANT
GOWN STRL REUS W/TWL 2XL LVL3 (GOWN DISPOSABLE) IMPLANT
GOWN STRL REUS W/TWL LRG LVL3 (GOWN DISPOSABLE) ×3
GOWN STRL REUS W/TWL XL LVL3 (GOWN DISPOSABLE) ×2
GRAFT BNE MATRIX VG FRMBL SM 1 (Bone Implant) IMPLANT
HALTER HD/CHIN CERV TRACTION D (MISCELLANEOUS) ×2 IMPLANT
HEMOSTAT POWDER KIT SURGIFOAM (HEMOSTASIS) ×3 IMPLANT
KIT BASIN OR (CUSTOM PROCEDURE TRAY) ×2 IMPLANT
KIT TURNOVER KIT B (KITS) ×2 IMPLANT
NDL HYPO 18GX1.5 BLUNT FILL (NEEDLE) ×1 IMPLANT
NDL SPNL 20GX3.5 QUINCKE YW (NEEDLE) ×1 IMPLANT
NEEDLE HYPO 18GX1.5 BLUNT FILL (NEEDLE) IMPLANT
NEEDLE SPNL 20GX3.5 QUINCKE YW (NEEDLE) ×2 IMPLANT
NS IRRIG 1000ML POUR BTL (IV SOLUTION) ×2 IMPLANT
OIL CARTRIDGE MAESTRO DRILL (MISCELLANEOUS) ×2
PACK LAMINECTOMY NEURO (CUSTOM PROCEDURE TRAY) ×2 IMPLANT
PIN DISTRACTION 14MM (PIN) ×2 IMPLANT
PLATE 4 75XNS SPNE CVD ANT T (Plate) IMPLANT
PLATE 4 ATLANTIS TRANS (Plate) ×1 IMPLANT
SCREW 4.0X13 (Screw) ×1 IMPLANT
SCREW BN 13X4XSLF DRL FXANG (Screw) IMPLANT
SCREW ST FIX 4 ATL 3120213 (Screw) ×9 IMPLANT
SPACER HEDRON C 12X14X6 0D (Spacer) ×1 IMPLANT
SPACER HEDRON C 12X14X7 0D (Spacer) ×2 IMPLANT
SPACER HEDRON C 12X14X8 7D (Spacer) ×1 IMPLANT
SPONGE INTESTINAL PEANUT (DISPOSABLE) ×2 IMPLANT
SPONGE SURGIFOAM ABS GEL 100 (HEMOSTASIS) ×2 IMPLANT
STRIP CLOSURE SKIN 1/2X4 (GAUZE/BANDAGES/DRESSINGS) ×2 IMPLANT
SUT VIC AB 3-0 SH 8-18 (SUTURE) ×2 IMPLANT
SUT VIC AB 4-0 PS2 27 (SUTURE) ×2 IMPLANT
TAPE CLOTH 4X10 WHT NS (GAUZE/BANDAGES/DRESSINGS) ×2 IMPLANT
TOWEL GREEN STERILE (TOWEL DISPOSABLE) ×2 IMPLANT
TOWEL GREEN STERILE FF (TOWEL DISPOSABLE) ×2 IMPLANT
WATER STERILE IRR 1000ML POUR (IV SOLUTION) ×2 IMPLANT

## 2021-05-06 NOTE — Op Note (Signed)
Preoperative diagnosis: Cervical spondylosis with radiculopathy from severe cervical stenosis at C3-4, C4-5, C5-6, C6-7 ? ?Postoperative diagnosis: Same ? ?Procedure: Anterior cervical discectomies and fusion at C3-4, C4-5, C5-6, C6-7 utilizing globus titanium cages packed with locally harvested autograft mixed with vivigen and anterior cervical plating utilizing the Atlantis translational plating system ? ?Surgeon: Dominica Severin Izel Hochberg ? ?Assistant: Nash Shearer ? ?Anesthesia: General ? ?EBL: Minimal ? ?HPI: 66 year old female progressive worsening neck and bilateral arm pain but worse on the left work-up revealed severe cervical spondylosis bilaterally with severe foraminal stenosis at all 4 levels.  Due to patient progression of clinical syndrome imaging findings and failed conservative treatment I recommended anterior cervical discectomies and fusion at those 4 levels.  I extensively reviewed the risks and benefits of the operation with her as well as perioperative course expectations of outcome and alternatives of surgery and she understood and agreed to proceed forward. ? ?Operative procedure: Patient was brought into the OR was induced under general anesthesia positioned supine the neck in slight extension 5 pounds halter traction.  The right 7 neck was prepped and draped in routine sterile fashion.  A curvilinear incision was made just off midline to the entry border of the sternocleidomastoid the avascular plane between the sternocleidomastoid and strap muscle was developed down to the prevertebral fascia and prevertebral was dissected away with Kitners.  Intraoperative x-ray confirmed defecation appropriate level.  Longus was then reflected laterally and self-retaining retractors were placed first attention was taken at C5-6 and C6-7 both disc bases had anterior osteophytes removed with a 3 Miller Kerrison punch disc base and sized disc base was drilled down capturing bone shavings and mucus trap.  Under  microscopic lamination working at C6-7 pathology here was primarily uncinate hypertrophy worse on the left this was all aggressively under Bitton posterior large ligament removed both endplates were aggressively under Bitton both C7 nerve roots were identified and skeletonized flush with the pedicle removing the uncinate and decompressing the foramen.  This was packed with Gelfoam tension taken to C5-6 pathology bilaterally severe foraminal stenosis and central stenosis aggressive undermining both endplates and removal of the posterior large ligament decompress central canal both C6 nerve roots.  Both C6 nerve roots were skeletonized flush with the pedicle.  At the end of the discectomies and there were no further stenosis on the spinal cord or nerve roots at either level so that I sized up to 10 titanium implants packed with locally harvested autograft mix and inserted at C5-6 and C6-7.  I then reposition the retractor at C3-4 and C4-5 in a similar fashion both disc bases were drilled down capturing the bone shavings and mucus trap and under microscope illumination aggressive under biting of both endplates and the uncinate decompress the central canal both levels and both sets and neuroforamina at C3-4 and C4-5.  I then sized up titanium cages for those levels as well packed with locally harvested autograft and inserted.  Then I escorted Surgifoam laterally to each cage packed some additional bone graft lateral to the cages and underneath the plate.  Sized up to a 75 mm lance translational plate confirmed position and input of implants and sizing with fluoroscopy.  And then all screws were placed they had excellent purchase locking mechanism was engaged.  Wounds and copiously irrigated meticulous hemostasis was maintained I placed a JP drain and closed the wound in layers with interrupted Vicryl and a running 4 subcuticular.  Dermabond benzoin Steri-Strips and a sterile dressing was applied patient  recovery in  stable condition.  At the end the case all needle count sponge counts were correct. ?

## 2021-05-06 NOTE — Anesthesia Postprocedure Evaluation (Signed)
Anesthesia Post Note ? ?Patient: ARNOLA CRITTENDON ? ?Procedure(s) Performed: CERVICAL THREE-FOUR, CERVICAL FOUR-FIVE, CERVICAL FIVE-SIX, CERVICAL SIX-SEVEN ANTERIOR CERVICAL DECOMPRESSION/DISCECTOMY FUSION (Spine Cervical) ? ?  ? ?Patient location during evaluation: PACU ?Anesthesia Type: General ?Level of consciousness: awake and alert ?Pain management: pain level controlled ?Vital Signs Assessment: post-procedure vital signs reviewed and stable ?Respiratory status: spontaneous breathing, nonlabored ventilation, respiratory function stable and patient connected to nasal cannula oxygen ?Cardiovascular status: blood pressure returned to baseline and stable ?Postop Assessment: no apparent nausea or vomiting ?Anesthetic complications: yes ?Comments: Probable L corneal abrasion. I saw patient in the PACU, she had some scleral edema, blurry vision in L eye.  ?Explained to patient what was going on, and treatment plan. Optho consult if vision deteriorates ? ? ?No notable events documented. ? ?Last Vitals:  ?Vitals:  ? 05/06/21 1310 05/06/21 1325  ?BP: (!) 154/80   ?Pulse: 63 71  ?Resp: (!) 9 (!) 8  ?Temp:  36.7 ?C  ?SpO2: 95% 93%  ?  ?Last Pain:  ?Vitals:  ? 05/06/21 1310  ?TempSrc:   ?PainSc: Asleep  ? ? ?  ?  ?  ?  ?  ?  ? ?Makira Holleman S ? ? ? ? ?

## 2021-05-06 NOTE — Transfer of Care (Signed)
Immediate Anesthesia Transfer of Care Note ? ?Patient: Dana Holder ? ?Procedure(s) Performed: CERVICAL THREE-FOUR, CERVICAL FOUR-FIVE, CERVICAL FIVE-SIX, CERVICAL SIX-SEVEN ANTERIOR CERVICAL DECOMPRESSION/DISCECTOMY FUSION (Spine Cervical) ? ?Patient Location: PACU ? ?Anesthesia Type:General ? ?Level of Consciousness: drowsy and patient cooperative ? ?Airway & Oxygen Therapy: Patient Spontanous Breathing and Patient connected to face mask oxygen ? ?Post-op Assessment: Report given to RN and Post -op Vital signs reviewed and stable ? ?Post vital signs: Reviewed and stable ? ?Last Vitals:  ?Vitals Value Taken Time  ?BP 152/98 05/06/21 1221  ?Temp    ?Pulse 75 05/06/21 1221  ?Resp 37 05/06/21 1222  ?SpO2 100 % 05/06/21 1221  ?Vitals shown include unvalidated device data. ? ?Last Pain:  ?Vitals:  ? 05/06/21 0708  ?TempSrc:   ?PainSc: 0-No pain  ?   ? ?  ? ?Complications: No notable events documented. ?

## 2021-05-06 NOTE — H&P (Signed)
Dana Holder is an 66 y.o. female.   ?Chief Complaint: Neck and left shoulder and arm pain ?HPI: 66 year old female with longstanding neck left shoulder and arm pain rating down C6-C7 nerve root pattern primarily but also in the top of her left shoulder.  Work-up revealed severe cervical spondylosis left with spur at C3-4 by foraminal stenosis at C4-5 severe central stenosis and by foraminal stenosis at C5-6 as well as central stenosis and a large spur on the left at C6-7.  Due to patient's progression of clinical syndrome imaging findings and failed conservative treatment, I recommended an anterior cervical discectomy and fusion at those 4 levels.  I have extensively gone over the risks and benefits of that operation with her as well as perioperative course expectations of outcome and alternatives of surgery and she understands and agrees to proceed forward. ? ?Past Medical History:  ?Diagnosis Date  ? Family history of brain cancer   ? Family history of colon cancer   ? Family history of kidney cancer   ? Family history of leukemia   ? Family history of lung cancer   ? Family history of ovarian cancer   ? Family history of pancreatic cancer   ? Family history of prostate cancer   ? Family history of stomach cancer   ? Family history of throat cancer   ? Gout   ? Hypothyroidism   ? ? ?Past Surgical History:  ?Procedure Laterality Date  ? BACK SURGERY  2002  ? lumbar disc  ? COLONOSCOPY N/A 12/08/2015  ? Procedure: COLONOSCOPY;  Surgeon: Danie Binder, MD;  Location: [facility];  Service: Endoscopy;  Laterality: N/A;  9:15 AM  ? TUBAL LIGATION    ? ? ?Family History  ?Problem Relation Age of Onset  ? Acute myelogenous leukemia Son   ? Throat cancer Father 39  ? Ovarian cancer Sister 71  ? Lung cancer Brother 27  ?     non-small cell  ? Brain cancer Maternal Aunt   ?     dx. in her 82s  ? Lung cancer Maternal Uncle   ?     dx. in his 62s or 60s, smoker  ? Heart attack Maternal Grandmother   ? Pancreatic  cancer Brother 37  ? Stomach cancer Maternal Aunt   ?     dx. in her 30s  ? Colon cancer Cousin   ?     dx. >50 - maternal cousin  ? Kidney cancer Cousin   ?     dx. in his 86s - maternal cousin  ? Prostate cancer Cousin 28  ?     maternal cousin  ? Anesthesia problems Neg Hx   ? Hypotension Neg Hx   ? Malignant hyperthermia Neg Hx   ? Pseudochol deficiency Neg Hx   ? ?Social History:  reports that she quit smoking about 18 years ago. Her smoking use included cigarettes. She has a 15.00 pack-year smoking history. She has never used smokeless tobacco. She reports that she does not drink alcohol and does not use drugs. ? ?Allergies: No Known Allergies ? ?Medications Prior to Admission  ?Medication Sig Dispense Refill  ? levothyroxine (SYNTHROID) 88 MCG tablet Take 88 mcg by mouth daily before breakfast.    ? naproxen sodium (ALEVE) 220 MG tablet Take 220 mg by mouth daily as needed (pain).    ? ? ?No results found for this or any previous visit (from the past 48 hour(s)). ?No results found. ? ?  Review of Systems  ?Musculoskeletal:  Positive for neck pain.  ?Neurological:  Positive for numbness.  ? ?Blood pressure 140/82, pulse 66, temperature 98.5 ?F (36.9 ?C), temperature source Oral, resp. rate 17, height '5\' 6"'$  (1.676 m), weight 105.2 kg, SpO2 98 %. ?Physical Exam ?HENT:  ?   Head: Normocephalic.  ?   Right Ear: Tympanic membrane normal.  ?   Nose: Nose normal.  ?Eyes:  ?   Pupils: Pupils are equal, round, and reactive to light.  ?Cardiovascular:  ?   Rate and Rhythm: Normal rate.  ?Pulmonary:  ?   Effort: Pulmonary effort is normal.  ?Abdominal:  ?   General: Abdomen is flat.  ?Musculoskeletal:     ?   General: Normal range of motion.  ?Skin: ?   General: Skin is warm.  ?Neurological:  ?   General: No focal deficit present.  ?   Mental Status: She is alert.  ?   Comments: Patient is awake alert strength is 5 and 5 deltoid, bicep, tricep, wrist flexion, wrist extension, hand intrinsics.  Lower extremity strength is  also 5 out of 5  ?  ? ?Assessment/Plan ?66-year presents for an ACDF C3-4, C4-5, C5-6, C6-7. ? ?Elaina Hoops, MD ?05/06/2021, 8:16 AM ? ? ? ?

## 2021-05-06 NOTE — Progress Notes (Signed)
Orthopedic Tech Progress Note ?Patient Details:  ?Dana Holder ?April 14, 1955 ?[id number] ? ?RN stated " patient has soft collar" ? ?Patient ID: Dana Holder, female   DOB: 04/22/1955, 66 y.o.   MRN: [id number] ? ?Dana Holder ?05/06/2021, 4:05 PM ? ?

## 2021-05-06 NOTE — Anesthesia Procedure Notes (Signed)
Procedure Name: Intubation ?Date/Time: 05/06/2021 8:42 AM ?Performed by: Justin Mend, RN ?Pre-anesthesia Checklist: Patient identified, Emergency Drugs available, Suction available and Patient being monitored ?Patient Re-evaluated:Patient Re-evaluated prior to induction ?Oxygen Delivery Method: Circle System Utilized ?Preoxygenation: Pre-oxygenation with 100% oxygen ?Induction Type: IV induction ?Ventilation: Mask ventilation without difficulty ?Laryngoscope Size: Glidescope and 3 (elective dt cervical disease) ?Grade View: Grade I ?Tube type: Oral ?Tube size: 7.0 mm ?Number of attempts: 1 ?Airway Equipment and Method: Rigid stylet and Video-laryngoscopy ?Placement Confirmation: ETT inserted through vocal cords under direct vision, positive ETCO2 and breath sounds checked- equal and bilateral ?Secured at: 22 cm ?Tube secured with: Tape ?Dental Injury: Teeth and Oropharynx as per pre-operative assessment  ?Comments: Performed by Justin Mend, SRNA under direct supervision of CRNA and MDA ? ? ? ? ?

## 2021-05-06 NOTE — Anesthesia Preprocedure Evaluation (Signed)
Anesthesia Evaluation  ?Patient identified by MRN, date of birth, ID band ?Patient awake ? ? ? ?Reviewed: ?Allergy & Precautions, NPO status , Patient's Chart, lab work & pertinent test results ? ?Airway ?Mallampati: II ? ?TM Distance: >3 FB ?Neck ROM: Full ? ? ? Dental ? ?(+) Missing, Dental Advisory Given ?  ?Pulmonary ?neg pulmonary ROS, former smoker,  ?  ?Pulmonary exam normal ?breath sounds clear to auscultation ? ? ? ? ? ? Cardiovascular ?negative cardio ROS ?Normal cardiovascular exam ?Rhythm:Regular Rate:Normal ? ? ?  ?Neuro/Psych ?negative neurological ROS ? negative psych ROS  ? GI/Hepatic ?negative GI ROS, Neg liver ROS,   ?Endo/Other  ?Hypothyroidism Morbid obesity ? Renal/GU ?negative Renal ROS  ?negative genitourinary ?  ?Musculoskeletal ?negative musculoskeletal ROS ?(+)  ? Abdominal ?  ?Peds ?negative pediatric ROS ?(+)  Hematology ?negative hematology ROS ?(+)   ?Anesthesia Other Findings ? ? Reproductive/Obstetrics ?negative OB ROS ? ?  ? ? ? ? ? ? ? ? ? ? ? ? ? ?  ?  ? ? ? ? ? ? ? ? ?Anesthesia Physical ?Anesthesia Plan ? ?ASA: 2 ? ?Anesthesia Plan: General  ? ?Post-op Pain Management: Dilaudid IV  ? ?Induction: Intravenous ? ?PONV Risk Score and Plan: 3 and Ondansetron, Dexamethasone and Treatment may vary due to age or medical condition ? ?Airway Management Planned: Oral ETT ? ?Additional Equipment:  ? ?Intra-op Plan:  ? ?Post-operative Plan: Extubation in OR ? ?Informed Consent: I have reviewed the patients History and Physical, chart, labs and discussed the procedure including the risks, benefits and alternatives for the proposed anesthesia with the patient or authorized representative who has indicated his/her understanding and acceptance.  ? ? ? ?Dental advisory given ? ?Plan Discussed with: CRNA and Surgeon ? ?Anesthesia Plan Comments:   ? ? ? ? ? ? ?Anesthesia Quick Evaluation ? ?

## 2021-05-07 MED ORDER — HYDROCODONE-ACETAMINOPHEN 7.5-325 MG PO TABS: 1.0000 | ORAL_TABLET | ORAL | 0 refills | Status: DC | PRN

## 2021-05-07 MED ORDER — CYCLOBENZAPRINE HCL 10 MG PO TABS: 10.0000 mg | ORAL_TABLET | Freq: Three times a day (TID) | ORAL | 0 refills | Status: DC | PRN

## 2021-05-07 MED ORDER — HYDROCODONE-ACETAMINOPHEN 7.5-325 MG PO TABS
1.0000 | ORAL_TABLET | ORAL | Status: DC | PRN
  Administered 2021-05-07: 1 via ORAL

## 2021-05-07 NOTE — Evaluation (Signed)
Physical Therapy Evaluation and Discharge ? ?Patient Details ?Name: Dana Holder ?MRN: [id number] ?DOB: 1955-11-27 ?Today's Date: 05/07/2021 ? ?History of Present Illness ? Pt is a 66 y/o female who presents s/p 4 level ACDF on 05/06/2021. PMH significant for 2 prior back surgeries, hypothyroidism, gout. ?  ?Clinical Impression ? Patient evaluated by Physical Therapy with no further acute PT needs identified. All education has been completed and the patient has no further questions. Pt was able to demonstrate transfers and ambulation with gross modified independence and no AD. Pt was educated on precautions, brace application/wearing schedule, appropriate activity progression, and car transfer. See below for any follow-up Physical Therapy or equipment needs. PT is signing off. Thank you for this referral.    ?   ? ?Recommendations for follow up therapy are one component of a multi-disciplinary discharge planning process, led by the attending physician.  Recommendations may be updated based on patient status, additional functional criteria and insurance authorization. ? ?Follow Up Recommendations No PT follow up ? ?  ?Assistance Recommended at Discharge PRN  ?Patient can return home with the following ? Assistance with cooking/housework;Assist for transportation ? ?  ?Equipment Recommendations None recommended by PT  ?Recommendations for Other Services ?    ?  ?Functional Status Assessment Patient has had a recent decline in their functional status and demonstrates the ability to make significant improvements in function in a reasonable and predictable amount of time.  ? ?  ?Precautions / Restrictions Precautions ?Precautions: Fall;Back ?Precaution Booklet Issued: Yes (comment) ?Precaution Comments: Reviewed handout and pt was cued for precautions during functional mobility. ?Required Braces or Orthoses: Cervical Brace ?Cervical Brace: Soft collar ?Restrictions ?Weight Bearing Restrictions: No  ? ?  ? ?Mobility ? Bed  Mobility ?  ?  ?  ?  ?  ?  ?  ?General bed mobility comments: Pt was received standing up in the room. ?  ? ?Transfers ?Overall transfer level: Modified independent ?Equipment used: None ?Transfers: Sit to/from Stand ?  ?  ?  ?  ?  ?  ?General transfer comment: No assist required for transition stand>sit. Good controlled lower to chair. ?  ? ?Ambulation/Gait ?Ambulation/Gait assistance: Modified independent (Device/Increase time) ?Gait Distance (Feet): 560 Feet ?Assistive device: None ?Gait Pattern/deviations: Step-through pattern, Decreased stride length, Trunk flexed ?Gait velocity: Decreased ?Gait velocity interpretation: 1.31 - 2.62 ft/sec, indicative of limited community ambulator ?  ?General Gait Details: Pt ambulating well with no LOB noted. Good posture maintained throughout. ? ?Stairs ?  ?  ?  ?  ?  ? ?Wheelchair Mobility ?  ? ?Modified Rankin (Stroke Patients Only) ?  ? ?  ? ?Balance Overall balance assessment: No apparent balance deficits (not formally assessed) ?  ?  ?  ?  ?  ?  ?  ?  ?  ?  ?  ?  ?  ?  ?  ?  ?  ?  ?   ? ? ? ?Pertinent Vitals/Pain Pain Assessment ?Pain Assessment: Faces ?Faces Pain Scale: Hurts a little bit ?Pain Location: Neck/shoulders ?Pain Descriptors / Indicators: Operative site guarding ?Pain Intervention(s): Limited activity within patient's tolerance, Monitored during session, Repositioned  ? ? ?Home Living Family/patient expects to be discharged to:: Private residence ?Living Arrangements: Spouse/significant other ?Available Help at Discharge: Family;Available 24 hours/day ?Type of Home: House ?Home Access: Stairs to enter ?  ?Entrance Stairs-Number of Steps: 1 ?  ?Home Layout: One level ?Home Equipment: Conservation officer, nature (2 wheels);Shower seat;BSC/3in1 ?   ?  ?  Prior Function Prior Level of Function : Independent/Modified Independent;Driving ?  ?  ?  ?  ?  ?  ?  ?  ?  ? ? ?Hand Dominance  ?   ? ?  ?Extremity/Trunk Assessment  ? Upper Extremity Assessment ?Upper Extremity Assessment:  LUE deficits/detail ?LUE Deficits / Details: Decreased strength and muscular endurance consistent with pre-op diagnosis ?  ? ?Lower Extremity Assessment ?Lower Extremity Assessment: Overall WFL for tasks assessed ?  ? ?Cervical / Trunk Assessment ?Cervical / Trunk Assessment: Neck Surgery  ?Communication  ? Communication: No difficulties  ?Cognition Arousal/Alertness: Awake/alert ?Behavior During Therapy: [facility] for tasks assessed/performed ?Overall Cognitive Status: Within Functional Limits for tasks assessed ?  ?  ?  ?  ?  ?  ?  ?  ?  ?  ?  ?  ?  ?  ?  ?  ?  ?  ?  ? ?  ?General Comments   ? ?  ?Exercises    ? ?Assessment/Plan  ?  ?PT Assessment Patient does not need any further PT services  ?PT Problem List   ? ?   ?  ?PT Treatment Interventions     ? ?PT Goals (Current goals can be found in the Care Plan section)  ?Acute Rehab PT Goals ?Patient Stated Goal: Home today ?PT Goal Formulation: All assessment and education complete, DC therapy ? ?  ?Frequency   ?  ? ? ?Co-evaluation   ?  ?  ?  ?  ? ? ?  ?AM-PAC PT "6 Clicks" Mobility  ?Outcome Measure Help needed turning from your back to your side while in a flat bed without using bedrails?: None ?Help needed moving from lying on your back to sitting on the side of a flat bed without using bedrails?: None ?Help needed moving to and from a bed to a chair (including a wheelchair)?: None ?Help needed standing up from a chair using your arms (e.g., wheelchair or bedside chair)?: None ?Help needed to walk in hospital room?: None ?Help needed climbing 3-5 steps with a railing? : None ?6 Click Score: 24 ? ?  ?End of Session Equipment Utilized During Treatment: Cervical collar ?Activity Tolerance: Patient tolerated treatment well ?Patient left: in chair;with call bell/phone within reach ?Nurse Communication: Mobility status ?PT Visit Diagnosis: Unsteadiness on feet (R26.81);Pain ?Pain - part of body:  (neck, shoudlers) ?  ? ?Time: 8185-9093 ?PT Time Calculation (min) (ACUTE  ONLY): 10 min ? ? ?Charges:   PT Evaluation ?$PT Eval Low Complexity: 1 Low ?  ?  ?   ? ? ?Rolinda Roan, PT, DPT ?Acute Rehabilitation Services ?Secure Chat Preferred ?Office: [phone]  ? ?Thelma Comp ?05/07/2021, 10:24 AM ? ?

## 2021-05-07 NOTE — Progress Notes (Signed)
Patient alert and oriented, mae's well, voiding adequate amount of urine, swallowing without difficulty, no c/o pain at time of discharge. Patient discharged home with family. Script and discharged instructions given to patient. Patient and family stated understanding of instructions given. Patient has an appointment with Dr. Cram 

## 2021-05-07 NOTE — Discharge Summary (Signed)
?  Physician Discharge Summary  ?Patient ID: ?Bernadene Person ?MRN: [id number] ?DOB/AGE: 65/11/1955 65 y.o. ?Estimated body mass index is 37.45 kg/m? as calculated from the following: ?  Height as of this encounter: '5\' 6"'$  (1.676 m). ?  Weight as of this encounter: 105.2 kg. ? ? ?Admit date: 05/06/2021 ?Discharge date: 05/07/2021 ? ?Admission Diagnoses: Cervical spinal stenosis spondylosis with radiculopathy ?Discharge Diagnoses: Same ?Principal Problem: ?  Spinal stenosis of cervical region ? ? ?Discharged Condition: good ? ?Hospital Course: Patient was admitted to hospital underwent an ACDF at C3-4, C4-5, C5-6, C6-7.  Postoperative patient did very well covering the floor on the floor was ambulating and voiding spontaneously tolerating regular diet and stable for discharge home.  Patient be discharged scheduled follow-up in 1 to 2 weeks. ? ?Consults: ?Significant Diagnostic Studies: ?Treatments: ACDF C3-7 ?Discharge Exam: ?Blood pressure 127/70, pulse 73, temperature 97.7 ?F (36.5 ?C), temperature source Oral, resp. rate 16, height '5\' 6"'$  (1.676 m), weight 105.2 kg, SpO2 96 %. ?Strength 5 out of 5 wound clean dry and intact ? ?Disposition: Home ? ? ?Allergies as of 05/07/2021   ?No Known Allergies ?  ? ?  ?Medication List  ?  ? ?TAKE these medications   ? ?cyclobenzaprine 10 MG tablet ?Commonly known as: FLEXERIL ?Take 1 tablet (10 mg total) by mouth 3 (three) times daily as needed for muscle spasms. ?  ?HYDROcodone-acetaminophen 7.5-325 MG tablet ?Commonly known as: NORCO ?Take 1-2 tablets by mouth every 4 (four) hours as needed for severe pain ((score 7 to 10)). ?  ?levothyroxine 88 MCG tablet ?Commonly known as: SYNTHROID ?Take 88 mcg by mouth daily before breakfast. ?  ?naproxen sodium 220 MG tablet ?Commonly known as: ALEVE ?Take 220 mg by mouth daily as needed (pain). ?  ? ?  ? ? ? ?Signed: ?Elaina Hoops ?05/07/2021, 8:21 AM ? ? ?

## 2021-05-07 NOTE — Discharge Instructions (Signed)
Wound Care ? ?Keep the incision clean and dry remove the outer dressing in 3 days, leave the Steri-Strips intact.  ?Do not put any creams, lotions, or ointments on incision. ?Leave steri-strips on neck.  They will fall off by themselves. ? ?Activity ?Walk each and every day, increasing distance each day. ?No lifting greater than 5 lbs.  Avoid excessive neck motion. ?No lifting no bending no twisting no driving or riding a car unless coming back and forth to see me. ?Wear neck brace at all times except when showering.  ? ?Diet ?Resume your normal diet.  ? ? ?Call Your Doctor If Any of These Occur ?Redness, drainage, or swelling at the wound.  ?Temperature greater than 101 degrees. ?Severe pain not relieved by pain medication. ?Incision starts to come apart. ?Follow Up Appt ?Call today for appointment in 1-2 weeks ([phone]) or for problems.  If you have any hardware placed in your spine, you will need an x-ray before your appointment. ?  ?

## 2021-05-07 NOTE — Evaluation (Signed)
Occupational Therapy Evaluation ?Patient Details ?Name: Dana Holder ?MRN: [id number] ?DOB: November 05, 1955 ?Today's Date: 05/07/2021 ? ? ?History of Present Illness Pt is a 66 y/o female who presents s/p 4 level ACDF on 05/06/2021. PMH significant for 2 prior back surgeries, hypothyroidism, gout.  ? ?Clinical Impression ?  ?Pt reported at PLOF limited with LUE pain with tasks but would complete ADL/IADLS. Pt will have their husband home to assist as needed for two weeks with the return to home. Pt completed UE ADLS with Mod I and LE ADLS with Mod I to supervision to be able to follow precautions. Pt reported they enjoy playing electronic games and was educated on positioning and following precautions. Occupational Therapy signing off.   ? ?Recommendations for follow up therapy are one component of a multi-disciplinary discharge planning process, led by the attending physician.  Recommendations may be updated based on patient status, additional functional criteria and insurance authorization.  ? ?Follow Up Recommendations ? Follow physician's recommendations for discharge plan and follow up therapies  ?  ?Assistance Recommended at Discharge PRN  ?Patient can return home with the following Assistance with cooking/housework;Assist for transportation ? ?  ?Functional Status Assessment ? Patient has had a recent decline in their functional status and demonstrates the ability to make significant improvements in function in a reasonable and predictable amount of time.  ?Equipment Recommendations ?  (long handle reacher)  ?  ?Recommendations for Other Services   ? ? ?  ?Precautions / Restrictions Precautions ?Precautions: Fall;Back ?Precaution Booklet Issued: Yes (comment) ?Precaution Comments: Reviewed handout and pt was cued for precautions during functional mobility. ?Required Braces or Orthoses: Cervical Brace ?Cervical Brace: Soft collar ?Restrictions ?Weight Bearing Restrictions: No  ? ?  ? ?Mobility Bed Mobility ?Overal  bed mobility: Modified Independent ?  ?  ?  ?  ?  ?  ?General bed mobility comments: Pt placed in supine and able to compelte with no use of bed rail ?  ? ?Transfers ?Overall transfer level: Needs assistance ?Equipment used: None, Rolling walker (2 wheels) ?Transfers: Sit to/from Stand ?Sit to Stand: Supervision ?  ?  ?  ?  ?  ?General transfer comment: Pt cued on positioning ?  ? ?  ?Balance Overall balance assessment: No apparent balance deficits (not formally assessed) ?  ?  ?  ?  ?  ?  ?  ?  ?  ?  ?  ?  ?  ?  ?  ?  ?  ?  ?   ? ?ADL either performed or assessed with clinical judgement  ? ?ADL Overall ADL's : Needs assistance/impaired ?Eating/Feeding: Independent;Sitting ?  ?Grooming: Oral care;Supervision/safety;Standing ?  ?Upper Body Bathing: Modified independent;Sitting ?  ?Lower Body Bathing: Supervison/ safety;Sit to/from stand ?  ?Upper Body Dressing : Modified independent;Sitting ?  ?Lower Body Dressing: Supervision/safety;Sit to/from stand ?  ?Toilet Transfer: Modified Independent ?  ?Toileting- Clothing Manipulation and Hygiene: Modified independent ?  ?  ?  ?Functional mobility during ADLs: Supervision/safety;Rolling walker (2 wheels) ?   ? ? ? ?Vision   ?   ?   ?Perception   ?  ?Praxis   ?  ? ?Pertinent Vitals/Pain Pain Assessment ?Pain Assessment: Faces ?Faces Pain Scale: Hurts a little bit ?Pain Location: Neck/shoulders ?Pain Descriptors / Indicators: Operative site guarding ?Pain Intervention(s): Limited activity within patient's tolerance  ? ? ? ?Hand Dominance   ?  ?Extremity/Trunk Assessment Upper Extremity Assessment ?Upper Extremity Assessment: LUE deficits/detail ?LUE Deficits / Details: Decreased strength  and muscular endurance consistent with pre-op diagnosis ?  ?Lower Extremity Assessment ?Lower Extremity Assessment: Defer to PT evaluation ?  ?Cervical / Trunk Assessment ?Cervical / Trunk Assessment: Neck Surgery ?  ?Communication Communication ?Communication: No difficulties ?  ?Cognition  Arousal/Alertness: Awake/alert ?Behavior During Therapy: [facility] for tasks assessed/performed ?Overall Cognitive Status: Within Functional Limits for tasks assessed ?  ?  ?  ?  ?  ?  ?  ?  ?  ?  ?  ?  ?  ?  ?  ?  ?  ?  ?  ?General Comments    ? ?  ?Exercises   ?  ?Shoulder Instructions    ? ? ?Home Living Family/patient expects to be discharged to:: Private residence ?Living Arrangements: Spouse/significant other ?Available Help at Discharge: Family;Available 24 hours/day ?Type of Home: House ?Home Access: Stairs to enter ?Entrance Stairs-Number of Steps: 1 ?  ?Home Layout: One level ?  ?  ?Bathroom Shower/Tub: Tub/shower unit ?  ?Bathroom Toilet: Standard ?  ?  ?Home Equipment: Conservation officer, nature (2 wheels);Shower seat;BSC/3in1 ?  ?  ?  ? ?  ?Prior Functioning/Environment Prior Level of Function : Independent/Modified Independent;Driving ?  ?  ?  ?  ?  ?  ?  ?  ?  ? ?  ?  ?OT Problem List: Decreased strength;Decreased range of motion;Decreased knowledge of use of DME or AE;Pain ?  ?   ?OT Treatment/Interventions: Self-care/ADL training;DME and/or AE instruction;Therapeutic activities;Patient/family education;Balance training  ?  ?OT Goals(Current goals can be found in the care plan section) Acute Rehab OT Goals ?Patient Stated Goal: to go home ?OT Goal Formulation: With patient ?Time For Goal Achievement: 05/21/21 ?Potential to Achieve Goals: Good  ?OT Frequency:   ?  ? ?Co-evaluation   ?  ?  ?  ?  ? ?  ?AM-PAC OT "6 Clicks" Daily Activity     ?Outcome Measure Help from another person eating meals?: None ?Help from another person taking care of personal grooming?: A Little ?Help from another person toileting, which includes using toliet, bedpan, or urinal?: None ?Help from another person bathing (including washing, rinsing, drying)?: A Little ?Help from another person to put on and taking off regular upper body clothing?: None ?Help from another person to put on and taking off regular lower body clothing?: A Little ?6  Click Score: 21 ?  ?End of Session Equipment Utilized During Treatment: Rolling walker (2 wheels);Gait belt ?Nurse Communication: Mobility status ? ?Activity Tolerance: Patient tolerated treatment well ?Patient left: in chair;with call bell/phone within reach ? ?OT Visit Diagnosis: Unsteadiness on feet (R26.81);Other abnormalities of gait and mobility (R26.89);Pain;Muscle weakness (generalized) (M62.81) ?Pain - Right/Left: Left ?Pain - part of body: Shoulder  ?              ?Time: 0626-9485 ?OT Time Calculation (min): 29 min ?Charges:  OT General Charges ?$OT Visit: 1 Visit ?OT Evaluation ?$OT Eval Low Complexity: 1 Low ?OT Treatments ?$Self Care/Home Management : 8-22 mins ? ?Joeseph Amor OTR/L  ?Acute Rehab Services  ?[phone] office number ?[phone] pager number ? ? ?Joeseph Amor ?05/07/2021, 11:48 AM ?

## 2021-05-08 ENCOUNTER — Encounter (HOSPITAL_COMMUNITY): Payer: Self-pay | Admitting: Neurosurgery

## 2021-05-08 MED FILL — Thrombin For Soln 5000 Unit: CUTANEOUS | Qty: 5000 | Status: AC

## 2021-08-03 ENCOUNTER — Encounter: Payer: Self-pay | Admitting: Internal Medicine

## 2021-08-03 ENCOUNTER — Ambulatory Visit: Payer: Medicare Other | Admitting: Gastroenterology

## 2021-09-11 ENCOUNTER — Other Ambulatory Visit (HOSPITAL_COMMUNITY): Payer: Self-pay | Admitting: Internal Medicine

## 2021-09-11 DIAGNOSIS — Z1231 Encounter for screening mammogram for malignant neoplasm of breast: Secondary | ICD-10-CM

## 2021-09-23 ENCOUNTER — Ambulatory Visit (HOSPITAL_COMMUNITY)
Admission: RE | Admit: 2021-09-23 | Discharge: 2021-09-23 | Disposition: A | Discharge status: Home / Self Care | Payer: Medicare Other | Source: Ambulatory Visit | Attending: Internal Medicine | Admitting: Internal Medicine

## 2021-09-23 DIAGNOSIS — Z1231 Encounter for screening mammogram for malignant neoplasm of breast: Secondary | ICD-10-CM | POA: Diagnosis present

## 2021-09-28 NOTE — Progress Notes (Unsigned)
GI Office Note    Referring Provider: Redmond School, MD Primary Care Physician:  Redmond School, MD  Primary Gastroenterologist:  Chief Complaint   No chief complaint on file.    History of Present Illness   Dana Holder is a 66 y.o. female presenting today     Colonoscopy 12/2015: - One 6 mm polyp at the hepatic flexure, removed with a hot snare. Resected and retrieved. - One 3 mm polyp in the sigmoid colon, removed with a cold biopsy forceps. Resected and retrieved. - MODERATE Diverticulosis in the sigmoid colon, in the descending colon and in the mid transverse colon. - SMALL Non-bleeding internal hemorrhoids. - Redundant LEFT colon. -sessile serrate polyp. -next colonoscopy five years.     Medications   Current Outpatient Medications  Medication Sig Dispense Refill   cyclobenzaprine (FLEXERIL) 10 MG tablet Take 1 tablet (10 mg total) by mouth 3 (three) times daily as needed for muscle spasms. 30 tablet 0   HYDROcodone-acetaminophen (NORCO) 7.5-325 MG tablet Take 1-2 tablets by mouth every 4 (four) hours as needed for severe pain ((score 7 to 10)). 30 tablet 0   levothyroxine (SYNTHROID) 88 MCG tablet Take 88 mcg by mouth daily before breakfast.     naproxen sodium (ALEVE) 220 MG tablet Take 220 mg by mouth daily as needed (pain).     No current facility-administered medications for this visit.    Allergies   Allergies as of 09/29/2021   (No Known Allergies)    Past Medical History   Past Medical History:  Diagnosis Date   Family history of brain cancer    Family history of colon cancer    Family history of kidney cancer    Family history of leukemia    Family history of lung cancer    Family history of ovarian cancer    Family history of pancreatic cancer    Family history of prostate cancer    Family history of stomach cancer    Family history of throat cancer    Gout    Hypothyroidism     Past Surgical History   Past Surgical  History:  Procedure Laterality Date   ANTERIOR CERVICAL DECOMPRESSION/DISCECTOMY FUSION 4 LEVELS N/A 05/06/2021   Procedure: CERVICAL THREE-FOUR, CERVICAL FOUR-FIVE, CERVICAL FIVE-SIX, CERVICAL SIX-SEVEN ANTERIOR CERVICAL DECOMPRESSION/DISCECTOMY FUSION;  Surgeon: Kary Kos, MD;  Location: [place];  Service: Neurosurgery;  Laterality: N/A;   BACK SURGERY  2002   lumbar disc   COLONOSCOPY N/A 12/08/2015   Procedure: COLONOSCOPY;  Surgeon: Danie Binder, MD;  Location[facility];  Service: Endoscopy;  Laterality: N/A;  9:15 AM   TUBAL LIGATION      Past Family History   Family History  Problem Relation Age of Onset   Acute myelogenous leukemia Son    Throat cancer Father 25   Ovarian cancer Sister 70   Lung cancer Brother 8       non-small cell   Brain cancer Maternal Aunt        dx. in her 67s   Lung cancer Maternal Uncle        dx. in his 71s or 43s, smoker   Heart attack Maternal Grandmother    Pancreatic cancer Brother 107   Stomach cancer Maternal Aunt        dx. in her 31s   Colon cancer Cousin        dx. >50 - maternal cousin   Kidney cancer Cousin  dx. in his 56s - maternal cousin   Prostate cancer Cousin 101       maternal cousin   Anesthesia problems Neg Hx    Hypotension Neg Hx    Malignant hyperthermia Neg Hx    Pseudochol deficiency Neg Hx     Past Social History   Social History   Socioeconomic History   Marital status: Married    Spouse name: Not on file   Number of children: Not on file   Years of education: Not on file   Highest education level: Not on file  Occupational History   Not on file  Tobacco Use   Smoking status: Former    Packs/day: 0.50    Years: 30.00    Total pack years: 15.00    Types: Cigarettes    Quit date: 06/21/2002    Years since quitting: 19.2   Smokeless tobacco: Never  Vaping Use   Vaping Use: Never used  Substance and Sexual Activity   Alcohol use: No   Drug use: No   Sexual activity: Yes    Birth  control/protection: Post-menopausal  Other Topics Concern   Not on file  Social History Narrative   Not on file   Social Determinants of Health   Financial Resource Strain: Not on file  Food Insecurity: Not on file  Transportation Needs: Not on file  Physical Activity: Not on file  Stress: Not on file  Social Connections: Not on file  Intimate Partner Violence: Not on file    Review of Systems   General: Negative for anorexia, weight loss, fever, chills, fatigue, weakness. Eyes: Negative for vision changes.  ENT: Negative for hoarseness, difficulty swallowing , nasal congestion. CV: Negative for chest pain, angina, palpitations, dyspnea on exertion, peripheral edema.  Respiratory: Negative for dyspnea at rest, dyspnea on exertion, cough, sputum, wheezing.  GI: See history of present illness. GU:  Negative for dysuria, hematuria, urinary incontinence, urinary frequency, nocturnal urination.  MS: Negative for joint pain, low back pain.  Derm: Negative for rash or itching.  Neuro: Negative for weakness, abnormal sensation, seizure, frequent headaches, memory loss,  confusion.  Psych: Negative for anxiety, depression, suicidal ideation, hallucinations.  Endo: Negative for unusual weight change.  Heme: Negative for bruising or bleeding. Allergy: Negative for rash or hives.  Physical Exam   There were no vitals taken for this visit.   General: Well-nourished, well-developed in no acute distress.  Head: Normocephalic, atraumatic.   Eyes: Conjunctiva pink, no icterus. Mouth: Oropharyngeal mucosa moist and pink , no lesions erythema or exudate. Neck: Supple without thyromegaly, masses, or lymphadenopathy.  Lungs: Clear to auscultation bilaterally.  Heart: Regular rate and rhythm, no murmurs rubs or gallops.  Abdomen: Bowel sounds are normal, nontender, nondistended, no hepatosplenomegaly or masses,  no abdominal bruits or hernia, no rebound or guarding.   Rectal:  *** Extremities: No lower extremity edema. No clubbing or deformities.  Neuro: Alert and oriented x 4 , grossly normal neurologically.  Skin: Warm and dry, no rash or jaundice.   Psych: Alert and cooperative, normal mood and affect.  Labs   *** Imaging Studies   MM 3D SCREEN BREAST BILATERAL  Result Date: 09/24/2021 CLINICAL DATA:  Screening. EXAM: DIGITAL SCREENING BILATERAL MAMMOGRAM WITH TOMOSYNTHESIS AND CAD TECHNIQUE: Bilateral screening digital craniocaudal and mediolateral oblique mammograms were obtained. Bilateral screening digital breast tomosynthesis was performed. The images were evaluated with computer-aided detection. COMPARISON:  Previous exam(s). ACR Breast Density Category b: There are scattered areas of  fibroglandular density. FINDINGS: There are no findings suspicious for malignancy. IMPRESSION: No mammographic evidence of malignancy. A result letter of this screening mammogram will be mailed directly to the patient. RECOMMENDATION: Screening mammogram in one year. (Code:SM-B-01Y) BI-RADS CATEGORY  1: Negative. Electronically Signed   By: Lovey Newcomer M.D.   On: 09/24/2021 08:30    Assessment       PLAN   ***   Laureen Ochs. Bobby Rumpf, [place], [facility]

## 2021-09-29 ENCOUNTER — Encounter: Payer: Self-pay | Admitting: *Deleted

## 2021-09-29 ENCOUNTER — Encounter: Payer: Self-pay | Admitting: Gastroenterology

## 2021-09-29 ENCOUNTER — Ambulatory Visit: Payer: Medicare Other | Admitting: Gastroenterology

## 2021-09-29 ENCOUNTER — Ambulatory Visit (INDEPENDENT_AMBULATORY_CARE_PROVIDER_SITE_OTHER): Payer: Medicare Other | Admitting: Gastroenterology

## 2021-09-29 VITALS — BP 118/75 | HR 62 | Temp 97.3°F | Ht 65.5 in | Wt 228.6 lb

## 2021-09-29 DIAGNOSIS — Z800 Family history of malignant neoplasm of digestive organs: Secondary | ICD-10-CM | POA: Diagnosis not present

## 2021-09-29 DIAGNOSIS — Z86010 Personal history of colonic polyps: Secondary | ICD-10-CM | POA: Diagnosis not present

## 2021-09-29 DIAGNOSIS — K5904 Chronic idiopathic constipation: Secondary | ICD-10-CM

## 2021-09-29 MED ORDER — PEG 3350-KCL-NA BICARB-NACL 420 G PO SOLR: 4000.0000 mL | Freq: Once | ORAL | 0 refills | Status: AC

## 2021-09-29 NOTE — Patient Instructions (Signed)
Take MiraLAX 1 capful or packet, mixed in 4 to 6 ounces of liquid, each day.  If your bowel movements do not become more regular, please let us know so that we can give you further instructions.  It is important to get your bowels moving daily prior to colonoscopy.  Once colonoscopy completed, you can go back to using MiraLAX daily as needed. Colonoscopy in the near future.  See separate instructions.

## 2021-10-21 ENCOUNTER — Encounter (HOSPITAL_COMMUNITY)
Admission: RE | Admit: 2021-10-21 | Discharge: 2021-10-21 | Disposition: A | Discharge status: Home / Self Care | Payer: Medicare Other | Source: Ambulatory Visit | Attending: Internal Medicine | Admitting: Internal Medicine

## 2021-10-21 HISTORY — DX: Pure hypercholesterolemia, unspecified: E78.00

## 2021-10-21 NOTE — Pre-Procedure Instructions (Signed)
Attempted pre-op phonecall. Left VM for her to call us back. 

## 2021-10-23 ENCOUNTER — Encounter (HOSPITAL_COMMUNITY): Payer: Self-pay

## 2021-10-23 ENCOUNTER — Other Ambulatory Visit: Payer: Self-pay

## 2021-10-26 ENCOUNTER — Encounter (HOSPITAL_COMMUNITY): Payer: Self-pay | Admitting: Anesthesiology

## 2021-10-26 ENCOUNTER — Encounter (HOSPITAL_COMMUNITY): Admission: RE | Payer: Self-pay | Source: Ambulatory Visit

## 2021-10-26 ENCOUNTER — Ambulatory Visit (HOSPITAL_COMMUNITY): Admission: RE | Admit: 2021-10-26 | Payer: Medicare Other | Source: Ambulatory Visit

## 2021-10-26 SURGERY — COLONOSCOPY WITH PROPOFOL
Anesthesia: Monitor Anesthesia Care

## 2021-10-26 NOTE — Progress Notes (Signed)
Called pt. Pt states she forgot about her procedure and stated "I didn't take any of my prep stuff".  RN explained to pt to call office to reschedule appointment  Procedure cancelled.

## 2021-11-18 ENCOUNTER — Encounter (HOSPITAL_COMMUNITY)
Admission: RE | Disposition: A | Discharge status: Home / Self Care | Payer: Self-pay | Source: Home / Self Care | Attending: Internal Medicine

## 2021-11-18 ENCOUNTER — Other Ambulatory Visit: Payer: Self-pay

## 2021-11-18 ENCOUNTER — Ambulatory Visit (HOSPITAL_COMMUNITY): Payer: Medicare Other | Admitting: Anesthesiology

## 2021-11-18 ENCOUNTER — Encounter (HOSPITAL_COMMUNITY): Payer: Self-pay

## 2021-11-18 ENCOUNTER — Ambulatory Visit (HOSPITAL_BASED_OUTPATIENT_CLINIC_OR_DEPARTMENT_OTHER): Payer: Medicare Other | Admitting: Anesthesiology

## 2021-11-18 ENCOUNTER — Ambulatory Visit (HOSPITAL_COMMUNITY)
Admission: RE | Admit: 2021-11-18 | Discharge: 2021-11-18 | Disposition: A | Discharge status: Home / Self Care | Payer: Medicare Other | Attending: Internal Medicine | Admitting: Internal Medicine

## 2021-11-18 DIAGNOSIS — Z86010 Personal history of colonic polyps: Secondary | ICD-10-CM

## 2021-11-18 DIAGNOSIS — Z1211 Encounter for screening for malignant neoplasm of colon: Secondary | ICD-10-CM | POA: Diagnosis present

## 2021-11-18 DIAGNOSIS — Z09 Encounter for follow-up examination after completed treatment for conditions other than malignant neoplasm: Secondary | ICD-10-CM | POA: Diagnosis not present

## 2021-11-18 DIAGNOSIS — K5790 Diverticulosis of intestine, part unspecified, without perforation or abscess without bleeding: Secondary | ICD-10-CM

## 2021-11-18 DIAGNOSIS — K635 Polyp of colon: Secondary | ICD-10-CM | POA: Diagnosis not present

## 2021-11-18 DIAGNOSIS — Z87891 Personal history of nicotine dependence: Secondary | ICD-10-CM | POA: Diagnosis not present

## 2021-11-18 DIAGNOSIS — K648 Other hemorrhoids: Secondary | ICD-10-CM | POA: Insufficient documentation

## 2021-11-18 DIAGNOSIS — K5730 Diverticulosis of large intestine without perforation or abscess without bleeding: Secondary | ICD-10-CM | POA: Insufficient documentation

## 2021-11-18 DIAGNOSIS — D122 Benign neoplasm of ascending colon: Secondary | ICD-10-CM | POA: Diagnosis not present

## 2021-11-18 DIAGNOSIS — Q438 Other specified congenital malformations of intestine: Secondary | ICD-10-CM | POA: Diagnosis not present

## 2021-11-18 HISTORY — PX: COLONOSCOPY WITH PROPOFOL: SHX5780

## 2021-11-18 HISTORY — PX: POLYPECTOMY: SHX5525

## 2021-11-18 SURGERY — COLONOSCOPY WITH PROPOFOL
Anesthesia: General

## 2021-11-18 MED ORDER — LIDOCAINE HCL (CARDIAC) PF 100 MG/5ML IV SOSY
PREFILLED_SYRINGE | INTRAVENOUS | Status: DC | PRN
  Administered 2021-11-18: 50 mg via INTRATRACHEAL

## 2021-11-18 MED ORDER — PROPOFOL 10 MG/ML IV BOLUS
INTRAVENOUS | Status: DC | PRN
  Administered 2021-11-18: 30 mg via INTRAVENOUS
  Administered 2021-11-18: 100 mg via INTRAVENOUS

## 2021-11-18 MED ORDER — LACTATED RINGERS IV SOLN: INTRAVENOUS | Status: DC

## 2021-11-18 MED ORDER — PROPOFOL 500 MG/50ML IV EMUL
INTRAVENOUS | Status: DC | PRN
  Administered 2021-11-18: 200 ug/kg/min via INTRAVENOUS

## 2021-11-18 NOTE — Op Note (Signed)
Dana Holder Procedure Date: 11/18/2021 11:02 AM MRN: [id number] Date of Birth: 29-Aug-1955 Attending MD: Elon Alas. Abbey Chatters DO CSN: [id number] Age: 66 Admit Type: Outpatient Procedure:                Colonoscopy Indications:              Surveillance: Personal history of adenomatous                            polyps on last colonoscopy > 5 years ago Providers:                Elon Alas. Abbey Chatters, DO, Caprice Kluver, [place], Technician Referring MD:              Medicines:                See the Anesthesia note for documentation of the                            administered medications Complications:            No immediate complications. Estimated Blood Loss:     Estimated blood loss was minimal. Procedure:                Pre-Anesthesia Assessment:                           - The anesthesia plan was to use monitored                            anesthesia care (MAC).                           After obtaining informed consent, the colonoscope                            was passed under direct vision. Throughout the                            procedure, the patient's blood pressure, pulse, and                            oxygen saturations were monitored continuously. The                            PCF-HQ190L ([id number]) scope was introduced through                            the anus and advanced to the the cecum, identified                            by appendiceal orifice and ileocecal valve. The                            colonoscopy was technically difficult  and complex                            due to a redundant colon and significant looping.                            Successful completion of the procedure was aided by                            changing the patient to a supine position and                            applying abdominal pressure. The patient tolerated                            the procedure  well. The quality of the bowel                            preparation was evaluated using the BBPS [facility]                            Bowel Preparation Scale) with scores of: Right                            Colon = 2 (minor amount of residual staining, small                            fragments of stool and/or opaque liquid, but mucosa                            seen well), Transverse Colon = 2 (minor amount of                            residual staining, small fragments of stool and/or                            opaque liquid, but mucosa seen well) and Left Colon                            = 2 (minor amount of residual staining, small                            fragments of stool and/or opaque liquid, but mucosa                            seen well). The total BBPS score equals 6. The                            quality of the bowel preparation was fair. Scope In: 11:13:14 AM Scope Out: 11:35:34 AM Scope Withdrawal Time: 0 hours 17 minutes 26 seconds  Total Procedure Duration: 0 hours 22 minutes 20 seconds  Findings:      The perianal and digital rectal  examinations were normal.      Non-bleeding internal hemorrhoids were found during endoscopy.      Multiple small and large-mouthed diverticula were found in the sigmoid       colon and descending colon.      A 2 mm polyp was found in the ileocecal valve. The polyp was sessile.       The polyp was removed with a cold biopsy forceps. Resection and       retrieval were complete.      The exam was otherwise without abnormality. Impression:               - Preparation of the colon was fair.                           - Non-bleeding internal hemorrhoids.                           - Diverticulosis in the sigmoid colon and in the                            descending colon.                           - One 2 mm polyp at the ileocecal valve, removed                            with a cold biopsy forceps. Resected and retrieved.                            - The examination was otherwise normal. Moderate Sedation:      Per Anesthesia Care Recommendation:           - Patient has a contact number available for                            emergencies. The signs and symptoms of potential                            delayed complications were discussed with the                            patient. Return to normal activities tomorrow.                            Written discharge instructions were provided to the                            patient.                           - Resume previous diet.                           - Continue present medications.                           - Await pathology results.                           -  Repeat colonoscopy in 5 years for surveillance                            and history of polyps prior                           - Return to GI clinic PRN. Procedure Code(s):        --- Professional ---                           [phone], Colonoscopy, flexible; with biopsy, single                            or multiple Diagnosis Code(s):        --- Professional ---                           Z86.010, Personal history of colonic polyps                           K64.8, Other hemorrhoids                           K63.5, Polyp of colon                           K57.30, Diverticulosis of large intestine without                            perforation or abscess without bleeding CPT copyright 2019 American Medical Association. All rights reserved. The codes documented in this report are preliminary and upon coder review may  be revised to meet current compliance requirements. Elon Alas. Abbey Chatters, DO [place] Abbey Chatters, DO 11/18/2021 11:39:37 AM This report has been signed electronically. Number of Addenda: 0

## 2021-11-18 NOTE — Discharge Instructions (Addendum)
  Colonoscopy Discharge Instructions  Read the instructions outlined below and refer to this sheet in the next few weeks. These discharge instructions provide you with general information on caring for yourself after you leave the hospital. Your doctor may also give you specific instructions. While your treatment has been planned according to the most current medical practices available, unavoidable complications occasionally occur.   ACTIVITY You may resume your regular activity, but move at a slower pace for the next 24 hours.  Take frequent rest periods for the next 24 hours.  Walking will help get rid of the air and reduce the bloated feeling in your belly (abdomen).  No driving for 24 hours (because of the medicine (anesthesia) used during the test).   Do not sign any important legal documents or operate any machinery for 24 hours (because of the anesthesia used during the test).  NUTRITION Drink plenty of fluids.  You may resume your normal diet as instructed by your doctor.  Begin with a light meal and progress to your normal diet. Heavy or fried foods are harder to digest and may make you feel sick to your stomach (nauseated).  Avoid alcoholic beverages for 24 hours or as instructed.  MEDICATIONS You may resume your normal medications unless your doctor tells you otherwise.  WHAT YOU CAN EXPECT TODAY Some feelings of bloating in the abdomen.  Passage of more gas than usual.  Spotting of blood in your stool or on the toilet paper.  IF YOU HAD POLYPS REMOVED DURING THE COLONOSCOPY: No aspirin products for 7 days or as instructed.  No alcohol for 7 days or as instructed.  Eat a soft diet for the next 24 hours.  FINDING OUT THE RESULTS OF YOUR TEST Not all test results are available during your visit. If your test results are not back during the visit, make an appointment with your caregiver to find out the results. Do not assume everything is normal if you have not heard from your  caregiver or the medical facility. It is important for you to follow up on all of your test results.  SEEK IMMEDIATE MEDICAL ATTENTION IF: You have more than a spotting of blood in your stool.  Your belly is swollen (abdominal distention).  You are nauseated or vomiting.  You have a temperature over 101.  You have abdominal pain or discomfort that is severe or gets worse throughout the day.   Your colonoscopy revealed 1 polyp(s) which I removed successfully. Await pathology results, my office will contact you. I recommend repeating colonoscopy in 5 years for surveillance purposes.   You also have diverticulosis and internal hemorrhoids. I would recommend increasing fiber in your diet or adding OTC Benefiber/Metamucil. Be sure to drink at least 4 to 6 glasses of water daily. Follow-up with GI as needed.   I hope you have a great rest of your week!  Charles K. Carver, D.O. Gastroenterology and Hepatology [facility]  

## 2021-11-18 NOTE — H&P (Signed)
Primary Care Physician:  Redmond School, MD Primary Gastroenterologist:  Dr. Abbey Chatters  Pre-Procedure History & Physical: HPI:  Dana Holder is a 66 y.o. female is here for a colonoscopy to be performed for surveillance purposes, personal history of adenomatous colon polyps 2017.  Past Medical History:  Diagnosis Date   Family history of brain cancer    Family history of colon cancer    Family history of kidney cancer    Family history of leukemia    Family history of lung cancer    Family history of ovarian cancer    Family history of pancreatic cancer    Family history of prostate cancer    Family history of stomach cancer    Family history of throat cancer    Gout    Hypercholesteremia    Hypercholesteremia    Hypothyroidism     Past Surgical History:  Procedure Laterality Date   ANTERIOR CERVICAL DECOMPRESSION/DISCECTOMY FUSION 4 LEVELS N/A 05/06/2021   Procedure: CERVICAL THREE-FOUR, CERVICAL FOUR-FIVE, CERVICAL FIVE-SIX, CERVICAL SIX-SEVEN ANTERIOR CERVICAL DECOMPRESSION/DISCECTOMY FUSION;  Surgeon: Kary Kos, MD;  Location: [facility];  Service: Neurosurgery;  Laterality: N/A;   BACK SURGERY  2002   lumbar disc, two surgeries   COLONOSCOPY N/A 12/08/2015   Procedure: COLONOSCOPY;  Surgeon: Danie Binder, MD;  Location: [facility];  Service: Endoscopy;  Laterality: N/A;  9:15 AM   TUBAL LIGATION      Prior to Admission medications   Medication Sig Start Date End Date Taking? Authorizing Provider  Cholecalciferol (VITAMIN D3) 250 MCG (10000 UT) TABS Take 10,000 Units by mouth daily.   Yes [provider]  levothyroxine (SYNTHROID) 88 MCG tablet Take 88 mcg by mouth daily before breakfast.   Yes [provider]  rosuvastatin (CRESTOR) 10 MG tablet Take 10 mg by mouth daily. 08/07/21  Yes [provider]    Allergies as of 10/28/2021   (No Known Allergies)    Family History  Problem Relation Age of Onset   Acute myelogenous leukemia Son     Throat cancer Father 56   Ovarian cancer Sister 54   Lung cancer Brother 37       non-small cell   Brain cancer Maternal Aunt        dx. in her 60s   Lung cancer Maternal Uncle        dx. in his 69s or 55s, smoker   Heart attack Maternal Grandmother    Pancreatic cancer Brother 25   Stomach cancer Maternal Aunt        dx. in her 86s   Colon cancer Cousin        dx. >50 - maternal cousin   Kidney cancer Cousin        dx. in his 40s - maternal cousin   Prostate cancer Cousin 56       maternal cousin   Anesthesia problems Neg Hx    Hypotension Neg Hx    Malignant hyperthermia Neg Hx    Pseudochol deficiency Neg Hx     Social History   Socioeconomic History   Marital status: Married    Spouse name: Not on file   Number of children: Not on file   Years of education: Not on file   Highest education level: Not on file  Occupational History   Not on file  Tobacco Use   Smoking status: Former    Packs/day: 0.50    Years: 30.00    Total pack years:  15.00    Types: Cigarettes    Quit date: 06/21/2002    Years since quitting: 19.4   Smokeless tobacco: Never  Vaping Use   Vaping Use: Never used  Substance and Sexual Activity   Alcohol use: No   Drug use: No   Sexual activity: Yes    Birth control/protection: Post-menopausal  Other Topics Concern   Not on file  Social History Narrative   Not on file   Social Determinants of Health   Financial Resource Strain: Not on file  Food Insecurity: Not on file  Transportation Needs: Not on file  Physical Activity: Not on file  Stress: Not on file  Social Connections: Not on file  Intimate Partner Violence: Not on file    Review of Systems: See HPI, otherwise negative ROS  Physical Exam: Vital signs in last 24 hours: Temp:  [98 F (36.7 C)] 98 F (36.7 C) (10/18 0915) Pulse Rate:  [71] 71 (10/18 0915) Resp:  [15] 15 (10/18 0915) BP: (121)/(71) 121/71 (10/18 0915) SpO2:  [98 %] 98 % (10/18 0915) Weight:  [103.7  kg] 103.7 kg (10/18 0915)   General:   Alert,  Well-developed, well-nourished, pleasant and cooperative in NAD Head:  Normocephalic and atraumatic. Eyes:  Sclera clear, no icterus.   Conjunctiva pink. Ears:  Normal auditory acuity. Nose:  No deformity, discharge,  or lesions. Mouth:  No deformity or lesions, dentition normal. Neck:  Supple; no masses or thyromegaly. Lungs:  Clear throughout to auscultation.   No wheezes, crackles, or rhonchi. No acute distress. Heart:  Regular rate and rhythm; no murmurs, clicks, rubs,  or gallops. Abdomen:  Soft, nontender and nondistended. No masses, hepatosplenomegaly or hernias noted. Normal bowel sounds, without guarding, and without rebound.   Msk:  Symmetrical without gross deformities. Normal posture. Extremities:  Without clubbing or edema. Neurologic:  Alert and  oriented x4;  grossly normal neurologically. Skin:  Intact without significant lesions or rashes. Cervical Nodes:  No significant cervical adenopathy. Psych:  Alert and cooperative. Normal mood and affect.  Impression/Plan: Dana Holder is here for a colonoscopy to be performed for surveillance purposes, personal history of adenomatous colon polyps 2017.  The risks of the procedure including infection, bleed, or perforation as well as benefits, limitations, alternatives and imponderables have been reviewed with the patient. Questions have been answered. All parties agreeable.

## 2021-11-18 NOTE — Anesthesia Preprocedure Evaluation (Signed)
Anesthesia Evaluation  Patient identified by MRN, date of birth, ID band Patient awake    Reviewed: Allergy & Precautions, H&P , NPO status , Patient's Chart, lab work & pertinent test results, reviewed documented beta blocker date and time   Airway Mallampati: II  TM Distance: >3 FB Neck ROM: full    Dental no notable dental hx.    Pulmonary neg pulmonary ROS, former smoker,    Pulmonary exam normal breath sounds clear to auscultation       Cardiovascular Exercise Tolerance: Good negative cardio ROS   Rhythm:regular Rate:Normal     Neuro/Psych negative neurological ROS  negative psych ROS   GI/Hepatic negative GI ROS, Neg liver ROS,   Endo/Other  negative endocrine ROS  Renal/GU negative Renal ROS  negative genitourinary   Musculoskeletal   Abdominal   Peds  Hematology negative hematology ROS (+)   Anesthesia Other Findings   Reproductive/Obstetrics negative OB ROS                             Anesthesia Physical Anesthesia Plan  ASA: 2  Anesthesia Plan: General   Post-op Pain Management:    Induction:   PONV Risk Score and Plan: Propofol infusion  Airway Management Planned:   Additional Equipment:   Intra-op Plan:   Post-operative Plan:   Informed Consent: I have reviewed the patients History and Physical, chart, labs and discussed the procedure including the risks, benefits and alternatives for the proposed anesthesia with the patient or authorized representative who has indicated his/her understanding and acceptance.     Dental Advisory Given  Plan Discussed with: CRNA  Anesthesia Plan Comments:         Anesthesia Quick Evaluation  

## 2021-11-18 NOTE — Transfer of Care (Signed)
Immediate Anesthesia Transfer of Care Note  Patient: Dana Holder  Procedure(s) Performed: COLONOSCOPY WITH PROPOFOL POLYPECTOMY  Patient Location: Endoscopy Unit  Anesthesia Type:General  Level of Consciousness: awake, alert , oriented and patient cooperative  Airway & Oxygen Therapy: Patient Spontanous Breathing  Post-op Assessment: Report given to RN, Post -op Vital signs reviewed and stable and Patient moving all extremities  Post vital signs: Reviewed and stable  Last Vitals:  Vitals Value Taken Time  BP    Temp    Pulse    Resp    SpO2      Last Pain:  Vitals:   11/18/21 1108  TempSrc:   PainSc: 0-No pain      Patients Stated Pain Goal: 7 ([id number])  Complications: No notable events documented.

## 2021-11-19 LAB — SURGICAL PATHOLOGY

## 2021-11-19 NOTE — Anesthesia Postprocedure Evaluation (Signed)
Anesthesia Post Note  Patient: Dana Holder  Procedure(s) Performed: COLONOSCOPY WITH PROPOFOL POLYPECTOMY  Patient location during evaluation: Phase II Anesthesia Type: General Level of consciousness: awake Pain management: pain level controlled Vital Signs Assessment: post-procedure vital signs reviewed and stable Respiratory status: spontaneous breathing and respiratory function stable Cardiovascular status: blood pressure returned to baseline and stable Postop Assessment: no headache and no apparent nausea or vomiting Anesthetic complications: no Comments: Late entry   No notable events documented.   Last Vitals:  Vitals:   11/18/21 0915 11/18/21 1139  BP: 121/71 138/82  Pulse: 71 70  Resp: 15 19  Temp: 36.7 C (!) 36.4 C  SpO2: 98% 98%    Last Pain:  Vitals:   11/18/21 1139  TempSrc: Axillary  PainSc: 0-No pain                 Louann Sjogren

## 2021-11-23 ENCOUNTER — Encounter (HOSPITAL_COMMUNITY): Payer: Self-pay | Admitting: Internal Medicine

## 2022-02-23 ENCOUNTER — Other Ambulatory Visit (HOSPITAL_COMMUNITY): Payer: Self-pay | Admitting: Neurosurgery

## 2022-02-23 DIAGNOSIS — M25512 Pain in left shoulder: Secondary | ICD-10-CM

## 2022-03-11 ENCOUNTER — Ambulatory Visit (HOSPITAL_COMMUNITY)
Admission: RE | Admit: 2022-03-11 | Discharge: 2022-03-11 | Disposition: A | Discharge status: Home / Self Care | Payer: Medicare Other | Source: Ambulatory Visit | Attending: Neurosurgery | Admitting: Neurosurgery

## 2022-03-11 DIAGNOSIS — M25512 Pain in left shoulder: Secondary | ICD-10-CM | POA: Diagnosis not present

## 2022-04-08 ENCOUNTER — Encounter: Payer: Self-pay | Admitting: Orthopaedic Surgery

## 2022-04-08 ENCOUNTER — Ambulatory Visit (INDEPENDENT_AMBULATORY_CARE_PROVIDER_SITE_OTHER): Payer: Medicare Other | Admitting: Orthopaedic Surgery

## 2022-04-08 DIAGNOSIS — M25512 Pain in left shoulder: Secondary | ICD-10-CM

## 2022-04-08 DIAGNOSIS — G8929 Other chronic pain: Secondary | ICD-10-CM

## 2022-04-08 MED ORDER — LIDOCAINE HCL 1 % IJ SOLN
3.0000 mL | INTRAMUSCULAR | Status: AC | PRN
  Administered 2022-04-08: 3 mL

## 2022-04-08 MED ORDER — METHYLPREDNISOLONE ACETATE 40 MG/ML IJ SUSP
40.0000 mg | INTRAMUSCULAR | Status: AC | PRN
  Administered 2022-04-08: 40 mg via INTRA_ARTICULAR

## 2022-04-08 NOTE — Progress Notes (Signed)
The patient is a 67 year old female who was sent from Dr. Dominica Severin cram from neurosurgery to evaluate and treat left shoulder pain there is actually MRI of her shoulder on the canopy system for me to review.  She fell about 2 to 3 months ago injuring her left shoulder.  She is able to reach overhead and behind her but she still has a chronic pain that is deep in the shoulder itself.  She also points around the subacromial outlet as a source of her pain.  She has had previous cervical spine surgery in April of last year.  She reports pain only around the left shoulder and does radiate in the subdeltoid region and near to the elbow but not severely.  Her left shoulder shows some weakness with abduction but she is using her rotator cuff appropriately.  There is no significant grinding of the glenohumeral joint but there is pain deep in the shoulder but a lot of pain around the subacromial outlet.  She does have a positive Neer and Hawkins signs.  Reaching behind her and overhead causes of pain and a crossarm test cause some pain.  Her liftoff is negative.  The MRI of her left shoulder is reviewed and it does show tendinosis of the rotator cuff but no tear of the tendons at all.  This is only mild.  All of the rotator cuff is intact.  There is no muscle atrophy or edema in the the only findings are moderate arthritis of the glenohumeral joint.  This certainly could the some of the source of her pain as well.  I did recommend starting with a steroid injection in her left shoulder subacromial space.  She agreed to this and tolerated it well.  She may end up benefiting from an intra-articular steroid injection of the left shoulder but I would like to see her back in about 3 weeks to see if we need to proceed that route.  All question concerns were answered and addressed.     Procedure Note  Patient: Dana Holder             Date of Birth: [birth year]           MRN: [id number]             Visit Date:  04/08/2022  Procedures: Visit Diagnoses:  1. Chronic left shoulder pain     Large Joint Inj: L subacromial bursa on 04/08/2022 1:19 PM Indications: pain and diagnostic evaluation Details: 22 G 1.5 in needle  Arthrogram: No  Medications: 3 mL lidocaine 1 %; 40 mg methylPREDNISolone acetate 40 MG/ML Outcome: tolerated well, no immediate complications Procedure, treatment alternatives, risks and benefits explained, specific risks discussed. Consent was given by the patient. Immediately prior to procedure a time out was called to verify the correct patient, procedure, equipment, support staff and site/side marked as required. Patient was prepped and draped in the usual sterile fashion.

## 2022-04-29 ENCOUNTER — Ambulatory Visit (INDEPENDENT_AMBULATORY_CARE_PROVIDER_SITE_OTHER): Payer: Medicare Other | Admitting: Sports Medicine

## 2022-04-29 ENCOUNTER — Other Ambulatory Visit: Payer: Self-pay

## 2022-04-29 ENCOUNTER — Encounter: Payer: Self-pay | Admitting: Orthopaedic Surgery

## 2022-04-29 ENCOUNTER — Ambulatory Visit (INDEPENDENT_AMBULATORY_CARE_PROVIDER_SITE_OTHER): Payer: Medicare Other | Admitting: Orthopaedic Surgery

## 2022-04-29 DIAGNOSIS — M7542 Impingement syndrome of left shoulder: Secondary | ICD-10-CM

## 2022-04-29 DIAGNOSIS — M25512 Pain in left shoulder: Secondary | ICD-10-CM | POA: Diagnosis not present

## 2022-04-29 DIAGNOSIS — G8929 Other chronic pain: Secondary | ICD-10-CM | POA: Diagnosis not present

## 2022-04-29 MED ORDER — BUPIVACAINE HCL 0.25 % IJ SOLN
2.0000 mL | INTRAMUSCULAR | Status: AC | PRN
  Administered 2022-04-29: 2 mL via INTRA_ARTICULAR

## 2022-04-29 MED ORDER — LIDOCAINE HCL 1 % IJ SOLN
2.0000 mL | INTRAMUSCULAR | Status: AC | PRN
  Administered 2022-04-29: 2 mL

## 2022-04-29 MED ORDER — METHYLPREDNISOLONE ACETATE 40 MG/ML IJ SUSP
80.0000 mg | INTRAMUSCULAR | Status: AC | PRN
  Administered 2022-04-29: 80 mg via INTRA_ARTICULAR

## 2022-04-29 NOTE — Progress Notes (Signed)
   Procedure Note  Patient: Dana Holder             Date of Birth: [birth year]           MRN: [id number]             Visit Date: 04/29/2022  Procedures: Visit Diagnoses:  1. Chronic left shoulder pain    Large Joint Inj: L glenohumeral on 04/29/2022 4:13 PM Indications: pain Details: 22 G 3.5 in needle, ultrasound-guided posterior approach Medications: 2 mL lidocaine 1 %; 2 mL bupivacaine 0.25 %; 80 mg methylPREDNISolone acetate 40 MG/ML Outcome: tolerated well, no immediate complications  US-guided glenohumeral joint injection, left shoulder After discussion on risks/benefits/indications, informed verbal consent was obtained. A timeout was then performed. The patient was positioned lying lateral recumbent on examination table. The patient's shoulder was prepped with betadine and multiple alcohol swabs and utilizing ultrasound guidance, the patient's glenohumeral joint was identified on ultrasound. Using ultrasound guidance a 22-gauge, 3.5 inch needle with a mixture of 2:2:2 cc's lidocaine:bupivicaine:depomedrol was directed from a lateral to medial direction via in-plane technique into the glenohumeral joint with visualization of appropriate spread of injectate into the joint. Patient tolerated the procedure well without immediate complications.      Procedure, treatment alternatives, risks and benefits explained, specific risks discussed. Consent was given by the patient. Immediately prior to procedure a time out was called to verify the correct patient, procedure, equipment, support staff and site/side marked as required. Patient was prepped and draped in the usual sterile fashion.    - I evaluated the patient about 10 minutes post-injection and she had improvement in pain and range of motion - follow-up with Dr. Ninfa Linden as indicated; I am happy to see them as needed  Elba Barman, DO [place]  This note was  dictated using Dragon naturally speaking software and may contain errors in syntax, spelling, or content which have not been identified prior to signing this note.

## 2022-04-29 NOTE — Progress Notes (Signed)
The patient is a 67 year old female who is coming in for follow-up after having a subacromial left shoulder steroid injection about 3 weeks ago.  She has a previous MRI of that left shoulder showing only some mild tendinosis of the rotator cuff but no tear.  There is no muscle atrophy.  The tendinosis which is mild.  She does have some moderate arthritis of the glenohumeral joint.  She said the subacromial injection did not help 1 day and she still has significant pain in that shoulder.  On exam there is no blocks or rotation of her left shoulder and is well located.  It definitely hurts with rotation and abduction.  Given the fact that the subacromial injection did not help one bit, I would like to send her to Dr. Rolena Infante for an ultrasound-guided intra-articular injection of a steroid in that left shoulder joint.  She agrees to trying this.  We will see if we get this done soon and then I would like to see her back in 4 weeks.

## 2022-05-18 ENCOUNTER — Ambulatory Visit (INDEPENDENT_AMBULATORY_CARE_PROVIDER_SITE_OTHER): Payer: Medicare Other | Admitting: Sports Medicine

## 2022-05-18 ENCOUNTER — Encounter: Payer: Self-pay | Admitting: Sports Medicine

## 2022-05-18 ENCOUNTER — Other Ambulatory Visit (INDEPENDENT_AMBULATORY_CARE_PROVIDER_SITE_OTHER): Payer: Medicare Other

## 2022-05-18 DIAGNOSIS — M25512 Pain in left shoulder: Secondary | ICD-10-CM

## 2022-05-18 DIAGNOSIS — Z981 Arthrodesis status: Secondary | ICD-10-CM | POA: Diagnosis not present

## 2022-05-18 DIAGNOSIS — M792 Neuralgia and neuritis, unspecified: Secondary | ICD-10-CM

## 2022-05-18 DIAGNOSIS — G8929 Other chronic pain: Secondary | ICD-10-CM

## 2022-05-18 DIAGNOSIS — M19012 Primary osteoarthritis, left shoulder: Secondary | ICD-10-CM

## 2022-05-18 NOTE — Progress Notes (Signed)
Not having complications from injection  She states she is still in pain "New symptoms: burning sensation down arm"

## 2022-05-18 NOTE — Progress Notes (Signed)
Dana Holder - 67 y.o. female MRN [id number]  Date of birth: [birth year]  Office Visit Note: Visit Date: 05/18/2022 PCP: Elfredia Nevins, MD Referred by: Elfredia Nevins, MD  Subjective: Chief Complaint  Patient presents with   Left Shoulder - Pain   HPI: Dana Holder is a pleasant 67 y.o. female who presents today for chronic left shoulder pain.  Chart review: Dr. Wynetta Emery note 03/18/22: ON 02/18/22 - "Dana Holder is back in follow-up in is been about 4-5 months since I had seen her. She had a fall she broke her fall by landing on her arms and torqued her left shoulder is been experiencing severe left anterior shoulder anterior chest wall and left arm down to her elbow pain since then. Has little bit discomfort her neck but the main part is centered around the shoulder."   She is status post 4 level cervical fusion with ACDF from C3-C7 on 05/06/2021, performed by Dr. Wynetta Emery.  Per patient she has been doing well since then until she had a fall where she landed with both arms extended, this exacerbated her left shoulder pain.  She underwent subacromial joint injection on 04/08/2022 by Dr. Magnus Ivan which she reports did not help her pain at all.  We did proceed with ultrasound-guided glenohumeral joint injection on 04/29/2022 which she states helped.  About 25-30% for a few weeks, then her pain returned.  She now is having a burning and tingling sensation from the top of her lateral shoulder all the way to the carpal row.  Denies any numbness or tingling in the fingers themselves.  Pertinent ROS were reviewed with the patient and found to be negative unless otherwise specified above in HPI.   Assessment & Plan: Visit Diagnoses:  1. Radicular pain in left arm   2. Chronic left shoulder pain   3. History of fusion of cervical spine   4. Primary osteoarthritis, left shoulder    Plan: Discussed with Dana Holder the nature and possible etiology of her left shoulder and radicular left upper extremity pain.   She does have at least moderate arthritis of the glenohumeral joint and I do think she exacerbated this with her fall.  I would hoping that she would get more than 25-30% of relief from the injection and that would last for only a few more weeks.  More of her pain now is burning and tingling which raises my suspicion for neural impingement or radicular symptoms, although I do not see any x-ray abnormalities of her prior fusion.  It is possible that all of these are contributing.  Given this, we discussed next steps would be obtaining nerve conduction study/EMG for the LUE by my partner, Dr. Alvester Morin.  She may continue her gabapentin 300 mg as needed until this time.  I will follow-up with her after this nerve conduction study to discuss next steps, ultimately Dr. Magnus Ivan did want to see her back and if there is anything surgical needed to be done we will likely get her back to see him and team.  Follow-up: Return for f/u 1-week after nerve conduction studies.   Meds & Orders: No orders of the defined types were placed in this encounter.   Orders Placed This Encounter  Procedures   XR Cervical Spine 2 or 3 views   Ambulatory referral to Physical Therapy   Ambulatory referral to Physical Medicine Rehab     Procedures: No procedures performed      Clinical History: No specialty comments available.  She  reports that she quit smoking about 19 years ago. Her smoking use included cigarettes. She has a 15.00 pack-year smoking history. She has never used smokeless tobacco. No results for input(s): "HGBA1C", "LABURIC" in the last 8760 hours.  Objective:    Physical Exam  Gen: Well-appearing, in no acute distress; non-toxic CV:  Well-perfused. Warm.  Resp: Breathing unlabored on room air; no wheezing. Psych: Fluid speech in conversation; appropriate affect; normal thought process Neuro: Sensation intact throughout. No gross coordination deficits.   Ortho Exam - Cervical: There is expected limited  flexion and extension given her ACDF.  Mild left trapezius hypertonicity.  Negative Spurling's test bilaterally.  - Left shoulder: The shoulder joint itself moves rather fluidly with flexion and abduction.  There is some pain with a bony block of external rotation at about 50 degrees.  There is no significant grinding or grating.  She has intact strength with all rotator cuff testing although some mild pain with resisted external rotation.  Imaging:  XR Cervical Spine 2 or 3 views 3 views of the cervical spine including AP, lateral and swimmer's view was  ordered and reviewed by myself today.  X-rays demonstrate expected  appearance of ACDF with 4-level fusion from C3-C7 with interbody disc  spacers at each level.  I cannot see any evidence of hardware abnormality  or adjacent segment disease.   *Independent review of the left shoulder MRI from 03/11/2022 was reviewed and interpreted by myself today.  There is moderate arthritis about the glenohumeral joint with some loss of cartilage.  She does have some tendinosis of the rotator cuff more specifically at the supraspinatus and infraspinatus but this is mild and I do not appreciate any evidence of high-grade tearing.  Narrative & Impression  CLINICAL DATA:  Status post fall in injury 2 months ago. With acute shoulder pain.   EXAM: MRI OF THE LEFT SHOULDER WITHOUT CONTRAST   TECHNIQUE: Multiplanar, multisequence MR imaging of the shoulder was performed. No intravenous contrast was administered.   COMPARISON:  None Available.   FINDINGS: Rotator cuff: Mild tendinosis of the supraspinatus tendon. Mild tendinosis of the infraspinatus tendon. Teres minor tendon is intact. Mild tendinosis of the subscapularis tendon.   Muscles: No muscle atrophy or edema. No intramuscular fluid collection or hematoma.   Biceps Long Head: Mild tendinosis of the intra-articular portion of the long head of the biceps tendon.   Acromioclavicular Joint:  Moderate arthropathy of the acromioclavicular joint. No subacromial/subdeltoid bursal fluid.   Glenohumeral Joint: No joint effusion. High-grade partial-thickness cartilage loss of the glenohumeral joint. Subchondral reactive marrow changes in the glenoid.   Labrum: Grossly intact, but evaluation is limited by lack of intraarticular fluid/contrast.   Bones: No fracture or dislocation. No aggressive osseous lesion.   Other: No fluid collection or hematoma.   IMPRESSION: 1. Mild tendinosis of the supraspinatus, infraspinatus and subscapularis tendons. 2. Mild tendinosis of the intra-articular portion of the long head of the biceps tendon. 3. Moderate-severe osteoarthritis of the glenohumeral joint.     Electronically Signed   By: Elige Ko M.D.   On: 03/13/2022 06:43    Past Medical/Family/Surgical/Social History: Medications & Allergies reviewed per EMR, new medications updated. Patient Active Problem List   Diagnosis Date Noted   History of adenomatous polyp of colon 09/29/2021   Spinal stenosis of cervical region 05/06/2021   Family history of pancreatic cancer    Family history of ovarian cancer    Family history of leukemia    Family  history of lung cancer    Family history of throat cancer    Family history of brain cancer    Family history of stomach cancer    Family history of colon cancer    Family history of kidney cancer    Family history of prostate cancer    Spinal stenosis at L4-L5 level 07/21/2018   Constipation 11/17/2015   Special screening for malignant neoplasms, colon 11/17/2015   Past Medical History:  Diagnosis Date   Family history of brain cancer    Family history of colon cancer    Family history of kidney cancer    Family history of leukemia    Family history of lung cancer    Family history of ovarian cancer    Family history of pancreatic cancer    Family history of prostate cancer    Family history of stomach cancer    Family  history of throat cancer    Gout    Hypercholesteremia    Hypercholesteremia    Hypothyroidism    Family History  Problem Relation Age of Onset   Acute myelogenous leukemia Son    Throat cancer Father 3   Ovarian cancer Sister 41   Lung cancer Brother 62       non-small cell   Brain cancer Maternal Aunt        dx. in her 11s   Lung cancer Maternal Uncle        dx. in his 59s or 41s, smoker   Heart attack Maternal Grandmother    Pancreatic cancer Brother 43   Stomach cancer Maternal Aunt        dx. in her 42s   Colon cancer Cousin        dx. >50 - maternal cousin   Kidney cancer Cousin        dx. in his 4s - maternal cousin   Prostate cancer Cousin 46       maternal cousin   Anesthesia problems Neg Hx    Hypotension Neg Hx    Malignant hyperthermia Neg Hx    Pseudochol deficiency Neg Hx    Past Surgical History:  Procedure Laterality Date   ANTERIOR CERVICAL DECOMPRESSION/DISCECTOMY FUSION 4 LEVELS N/A 05/06/2021   Procedure: CERVICAL THREE-FOUR, CERVICAL FOUR-FIVE, CERVICAL FIVE-SIX, CERVICAL SIX-SEVEN ANTERIOR CERVICAL DECOMPRESSION/DISCECTOMY FUSION;  Surgeon: Donalee Citrin, MD;  Location: [facility] OR;  Service: Neurosurgery;  Laterality: N/A;   BACK SURGERY  2002   lumbar disc, two surgeries   COLONOSCOPY N/A 12/08/2015   Procedure: COLONOSCOPY;  Surgeon: West Bali, MD;  Location: [facility];  Service: Endoscopy;  Laterality: N/A;  9:15 AM   COLONOSCOPY WITH PROPOFOL N/A 11/18/2021   Procedure: COLONOSCOPY WITH PROPOFOL;  Surgeon: Lanelle Bal, DO;  Location: [facility];  Service: Endoscopy;  Laterality: N/A;  10:15 am   POLYPECTOMY  11/18/2021   Procedure: POLYPECTOMY;  Surgeon: Lanelle Bal, DO;  Location: [facility];  Service: Endoscopy;;   TUBAL LIGATION     Social History   Occupational History   Not on file  Tobacco Use   Smoking status: Former    Packs/day: 0.50    Years: 30.00    Additional pack years: 0.00    Total pack years: 15.00     Types: Cigarettes    Quit date: 06/21/2002    Years since quitting: 19.9   Smokeless tobacco: Never  Vaping Use   Vaping Use: Never used  Substance and Sexual Activity  Alcohol use: No   Drug use: No   Sexual activity: Yes    Birth control/protection: Post-menopausal

## 2022-05-26 ENCOUNTER — Encounter: Payer: Self-pay | Admitting: Physical Medicine and Rehabilitation

## 2022-05-26 ENCOUNTER — Ambulatory Visit (INDEPENDENT_AMBULATORY_CARE_PROVIDER_SITE_OTHER): Payer: Medicare Other | Admitting: Physical Medicine and Rehabilitation

## 2022-05-26 DIAGNOSIS — R202 Paresthesia of skin: Secondary | ICD-10-CM | POA: Diagnosis not present

## 2022-05-26 DIAGNOSIS — M792 Neuralgia and neuritis, unspecified: Secondary | ICD-10-CM

## 2022-05-26 DIAGNOSIS — Z981 Arthrodesis status: Secondary | ICD-10-CM | POA: Diagnosis not present

## 2022-05-26 DIAGNOSIS — M25512 Pain in left shoulder: Secondary | ICD-10-CM | POA: Diagnosis not present

## 2022-05-26 DIAGNOSIS — G8929 Other chronic pain: Secondary | ICD-10-CM

## 2022-05-26 DIAGNOSIS — M19012 Primary osteoarthritis, left shoulder: Secondary | ICD-10-CM

## 2022-05-26 NOTE — Progress Notes (Signed)
Functional Pain Scale - descriptive words and definitions  Mild (2)   Noticeable when not distracted/no impact on ADL's/sleep only slightly affected and able to   use both passive and active distraction for comfort. Mild range order  Average Pain 8  Right handed. Pain in left wrist, numbness in left shoulder

## 2022-05-31 ENCOUNTER — Telehealth: Payer: Self-pay | Admitting: Physical Medicine and Rehabilitation

## 2022-05-31 NOTE — Telephone Encounter (Signed)
Patient states she her arm is hurting ans she need to have an appointment with Dr. Alvester Morin.

## 2022-06-01 ENCOUNTER — Telehealth: Payer: Self-pay | Admitting: Physical Medicine and Rehabilitation

## 2022-06-01 NOTE — Telephone Encounter (Signed)
Patient waiting on a call about Therapy said Dr. Alvester Morin advised she would be receiving a call. Please advise

## 2022-06-01 NOTE — Telephone Encounter (Signed)
Patient is saying she has not been contacted for PT, but the referral says she was being seen elsewhere

## 2022-06-02 ENCOUNTER — Other Ambulatory Visit: Payer: Self-pay | Admitting: Sports Medicine

## 2022-06-02 DIAGNOSIS — G8929 Other chronic pain: Secondary | ICD-10-CM

## 2022-06-02 NOTE — Procedures (Signed)
EMG & NCV Findings: Evaluation of the left median (across palm) sensory nerve showed prolonged distal peak latency (Wrist, 4.3 ms).  The left ulnar sensory nerve showed prolonged distal peak latency (3.8 ms) and decreased conduction velocity (Wrist-5th Digit, 37 m/s).  All remaining nerves (as indicated in the following tables) were within normal limits.    All examined muscles (as indicated in the following table) showed no evidence of electrical instability.    Impression: The above electrodiagnostic study is ABNORMAL and reveals evidence of a mild left median nerve entrapment at the wrist (carpal tunnel syndrome) affecting sensory components. There is no significant electrodiagnostic evidence of any other focal nerve entrapment, brachial plexopathy or cervical radiculopathy.   This would not exclude cervical radiculopathy radiculitis.  As you know, this particular electrodiagnostic study cannot rule out chemical radiculitis or sensory only radiculopathy.  Recommendations: 1.  Follow-up with referring physician.  Consider MRI of the cervical spine depending on clinical correlation. 2.  Continue current management of symptoms.  Physical or occupational therapy of the shoulder and arm.  ___________________________ Elease Hashimoto Board Certified, American Board of Physical Medicine and Rehabilitation    Nerve Conduction Studies Anti Sensory Summary Table   Stim Site NR Peak (ms) Norm Peak (ms) P-T Amp (V) Norm P-T Amp Site1 Site2 Delta-P (ms) Dist (cm) Vel (m/s) Norm Vel (m/s)  Left Median Acr Palm Anti Sensory (2nd Digit)  30.6C  Wrist    *4.3 <3.6 13.1 >10 Wrist Palm 2.3 0.0    Palm    2.0 <2.0 9.5         Left Radial Anti Sensory (Base 1st Digit)  28.6C  Wrist    2.3 <3.1 11.9  Wrist Base 1st Digit 2.3 0.0    Left Ulnar Anti Sensory (5th Digit)  29.4C  Wrist    *3.8 <3.7 18.2 >15.0 Wrist 5th Digit 3.8 14.0 *37 >38   Motor Summary Table   Stim Site NR Onset (ms) Norm Onset  (ms) O-P Amp (mV) Norm O-P Amp Site1 Site2 Delta-0 (ms) Dist (cm) Vel (m/s) Norm Vel (m/s)  Left Median Motor (Abd Poll Brev)  28.2C  Wrist    4.0 <4.2 9.1 >5 Elbow Wrist 3.8 20.0 53 >50  Elbow    7.8  9.9         Left Ulnar Motor (Abd Dig Min)  28C  Wrist    3.4 <4.2 3.4 >3 B Elbow Wrist 3.2 19.0 59 >53  B Elbow    6.6  8.2  A Elbow B Elbow 1.8 10.0 56 >53  A Elbow    8.4  7.2          EMG   Side Muscle Nerve Root Ins Act Fibs Psw Amp Dur Poly Recrt Int Dennie Bible Comment  Left 1stDorInt Ulnar C8-T1 Nml Nml Nml Nml Nml 0 Nml Nml   Left Abd Poll Brev Median C8-T1 Nml Nml Nml Nml Nml 0 Nml Nml   Left ExtDigCom   Nml Nml Nml Nml Nml 0 Nml Nml   Left Triceps Radial C6-7-8 Nml Nml Nml Nml Nml 0 Nml Nml   Left Deltoid Axillary C5-6 Nml Nml Nml Nml Nml 0 Nml Nml     Nerve Conduction Studies Anti Sensory Left/Right Comparison   Stim Site L Lat (ms) R Lat (ms) L-R Lat (ms) L Amp (V) R Amp (V) L-R Amp (%) Site1 Site2 L Vel (m/s) R Vel (m/s) L-R Vel (m/s)  Median Acr Palm Anti Sensory (2nd Digit)  30.6C  Wrist *4.3   13.1   Wrist Palm     Palm 2.0   9.5         Radial Anti Sensory (Base 1st Digit)  28.6C  Wrist 2.3   11.9   Wrist Base 1st Digit     Ulnar Anti Sensory (5th Digit)  29.4C  Wrist *3.8   18.2   Wrist 5th Digit *37     Motor Left/Right Comparison   Stim Site L Lat (ms) R Lat (ms) L-R Lat (ms) L Amp (mV) R Amp (mV) L-R Amp (%) Site1 Site2 L Vel (m/s) R Vel (m/s) L-R Vel (m/s)  Median Motor (Abd Poll Brev)  28.2C  Wrist 4.0   9.1   Elbow Wrist 53    Elbow 7.8   9.9         Ulnar Motor (Abd Dig Min)  28C  Wrist 3.4   3.4   B Elbow Wrist 59    B Elbow 6.6   8.2   A Elbow B Elbow 56    A Elbow 8.4   7.2            Waveforms:

## 2022-06-02 NOTE — Progress Notes (Signed)
Dana Holder - 67 y.o. female MRN [id number]  Date of birth: 15-May-1955  Office Visit Note: Visit Date: 05/26/2022 PCP: Elfredia Nevins, MD Referred by: Madelyn Brunner, DO  Subjective: Chief Complaint  Patient presents with   Left Wrist - Pain   Left Shoulder - Numbness   HPI:  Dana Holder is a 67 y.o. female who comes in today at the request of Dr. Madelyn Brunner for evaluation and management of chronic, worsening and severe pain, numbness and tingling in the Left upper extremities.  Patient is Right hand dominant.  She reports 6 out of 10 average pain but only 2 out of 10 on the pain functional scale of left shoulder and arm pain.  She relates more anterior lateral shoulder with a burning sensation down the arm to the wrist.  No real numbness or tingling or paresthesias to the hand.  She is right-hand dominant.  No prior surgeries to the shoulder or wrist.  By way of review she has had 4 level cervical fusion with ACDF from C3-C7 on 05/06/2021, performed by Dr. Wynetta Emery.  He reported and the patient reported that in the middle of January the patient had a fall where she extended both arms and since that time has had severe left anterior shoulder pain and some chest wall pain and pain down the elbow and wrist.  Should be noted that she was doing well from a pain standpoint postsurgery until this fall.  She was seen by Dr. Magnus Ivan who performed a subacromial injection without much relief diagnostically.  Dr. Shon Baton then performed ultrasound-guided glenohumeral joint injection that seemed to help 25 to 30%.  Unfortunately that would be in the realm of placebo numbers.  She has had x-ray imaging since the fall but I do not think she has had updated cervical MRI.  MRI of the shoulder is reviewed below but did show some glenohumeral arthritis of the joint.  This was not an MRI arthrogram.  She feels like the symptoms are different than she had prior to the surgery.  Managing of the shoulder has not been  revealing.  She is not diabetic but is hypothyroid on Synthroid.   I spent more than 30 minutes speaking face-to-face with the patient with 50% of the time in counseling and discussing coordination of care.       Review of Systems  Musculoskeletal:  Positive for joint pain and neck pain.  All other systems reviewed and are negative.  Otherwise per HPI.  Assessment & Plan: Visit Diagnoses:    ICD-10-CM   1. Paresthesia of skin  R20.2 NCV with EMG (electromyography)    2. Chronic left shoulder pain  M25.512    G89.29     3. Radicular pain in left arm  M79.2     4. History of fusion of cervical spine  Z98.1     5. Primary osteoarthritis, left shoulder  M19.012       Plan: Impression: The above electrodiagnostic study is ABNORMAL and reveals evidence of a mild left median nerve entrapment at the wrist (carpal tunnel syndrome) affecting sensory components. There is no significant electrodiagnostic evidence of any other focal nerve entrapment, brachial plexopathy or cervical radiculopathy.   This would not exclude cervical radiculopathy radiculitis.  As you know, this particular electrodiagnostic study cannot rule out chemical radiculitis or sensory only radiculopathy.  Recommendations: 1.  Follow-up with referring physician.  Consider MRI of the cervical spine depending on clinical correlation. 2.  Continue current  management of symptoms.  Physical or occupational therapy of the shoulder and arm.  Meds & Orders: No orders of the defined types were placed in this encounter.   Orders Placed This Encounter  Procedures   NCV with EMG (electromyography)    Follow-up: Return for Madelyn Brunner, DO.   Procedures: No procedures performed  EMG & NCV Findings: Evaluation of the left median (across palm) sensory nerve showed prolonged distal peak latency (Wrist, 4.3 ms).  The left ulnar sensory nerve showed prolonged distal peak latency (3.8 ms) and decreased conduction velocity  (Wrist-5th Digit, 37 m/s).  All remaining nerves (as indicated in the following tables) were within normal limits.    All examined muscles (as indicated in the following table) showed no evidence of electrical instability.    Impression: The above electrodiagnostic study is ABNORMAL and reveals evidence of a mild left median nerve entrapment at the wrist (carpal tunnel syndrome) affecting sensory components. There is no significant electrodiagnostic evidence of any other focal nerve entrapment, brachial plexopathy or cervical radiculopathy.   This would not exclude cervical radiculopathy radiculitis.  As you know, this particular electrodiagnostic study cannot rule out chemical radiculitis or sensory only radiculopathy.  Recommendations: 1.  Follow-up with referring physician.  Consider MRI of the cervical spine depending on clinical correlation. 2.  Continue current management of symptoms.  Physical or occupational therapy of the shoulder and arm.  ___________________________ Dana Holder Board Certified, American Board of Physical Medicine and Rehabilitation    Nerve Conduction Studies Anti Sensory Summary Table   Stim Site NR Peak (ms) Norm Peak (ms) P-T Amp (V) Norm P-T Amp Site1 Site2 Delta-P (ms) Dist (cm) Vel (m/s) Norm Vel (m/s)  Left Median Acr Palm Anti Sensory (2nd Digit)  30.6C  Wrist    *4.3 <3.6 13.1 >10 Wrist Palm 2.3 0.0    Palm    2.0 <2.0 9.5         Left Radial Anti Sensory (Base 1st Digit)  28.6C  Wrist    2.3 <3.1 11.9  Wrist Base 1st Digit 2.3 0.0    Left Ulnar Anti Sensory (5th Digit)  29.4C  Wrist    *3.8 <3.7 18.2 >15.0 Wrist 5th Digit 3.8 14.0 *37 >38   Motor Summary Table   Stim Site NR Onset (ms) Norm Onset (ms) O-P Amp (mV) Norm O-P Amp Site1 Site2 Delta-0 (ms) Dist (cm) Vel (m/s) Norm Vel (m/s)  Left Median Motor (Abd Poll Brev)  28.2C  Wrist    4.0 <4.2 9.1 >5 Elbow Wrist 3.8 20.0 53 >50  Elbow    7.8  9.9         Left Ulnar Motor (Abd  Dig Min)  28C  Wrist    3.4 <4.2 3.4 >3 B Elbow Wrist 3.2 19.0 59 >53  B Elbow    6.6  8.2  A Elbow B Elbow 1.8 10.0 56 >53  A Elbow    8.4  7.2          EMG   Side Muscle Nerve Root Ins Act Fibs Psw Amp Dur Poly Recrt Int Dennie Bible Comment  Left 1stDorInt Ulnar C8-T1 Nml Nml Nml Nml Nml 0 Nml Nml   Left Abd Poll Brev Median C8-T1 Nml Nml Nml Nml Nml 0 Nml Nml   Left ExtDigCom   Nml Nml Nml Nml Nml 0 Nml Nml   Left Triceps Radial C6-7-8 Nml Nml Nml Nml Nml 0 Nml Nml   Left Deltoid Axillary C5-6 Nml  Nml Nml Nml Nml 0 Nml Nml     Nerve Conduction Studies Anti Sensory Left/Right Comparison   Stim Site L Lat (ms) R Lat (ms) L-R Lat (ms) L Amp (V) R Amp (V) L-R Amp (%) Site1 Site2 L Vel (m/s) R Vel (m/s) L-R Vel (m/s)  Median Acr Palm Anti Sensory (2nd Digit)  30.6C  Wrist *4.3   13.1   Wrist Palm     Palm 2.0   9.5         Radial Anti Sensory (Base 1st Digit)  28.6C  Wrist 2.3   11.9   Wrist Base 1st Digit     Ulnar Anti Sensory (5th Digit)  29.4C  Wrist *3.8   18.2   Wrist 5th Digit *37     Motor Left/Right Comparison   Stim Site L Lat (ms) R Lat (ms) L-R Lat (ms) L Amp (mV) R Amp (mV) L-R Amp (%) Site1 Site2 L Vel (m/s) R Vel (m/s) L-R Vel (m/s)  Median Motor (Abd Poll Brev)  28.2C  Wrist 4.0   9.1   Elbow Wrist 53    Elbow 7.8   9.9         Ulnar Motor (Abd Dig Min)  28C  Wrist 3.4   3.4   B Elbow Wrist 59    B Elbow 6.6   8.2   A Elbow B Elbow 56    A Elbow 8.4   7.2            Waveforms:             Clinical History: MRI left shoulder.    IMPRESSION: 1. Mild tendinosis of the supraspinatus, infraspinatus and subscapularis tendons. 2. Mild tendinosis of the intra-articular portion of the long head of the biceps tendon. 3. Moderate-severe osteoarthritis of the glenohumeral joint.     Electronically Signed   By: Elige Ko M.D.   On: 03/13/2022 06:43     Objective:  VS:  HT:    WT:   BMI:     BP:   HR: bpm  TEMP: ( )  RESP:  Physical  Exam Vitals and nursing note reviewed.  Constitutional:      General: She is not in acute distress.    Appearance: Normal appearance. She is well-developed. She is not ill-appearing.  HENT:     Head: Normocephalic and atraumatic.  Eyes:     Conjunctiva/sclera: Conjunctivae normal.     Pupils: Pupils are equal, round, and reactive to light.  Cardiovascular:     Rate and Rhythm: Normal rate.     Pulses: Normal pulses.  Pulmonary:     Effort: Pulmonary effort is normal.  Musculoskeletal:        General: No swelling, tenderness or deformity.     Right lower leg: No edema.     Left lower leg: No edema.     Comments: Inspection reveals no atrophy of the bilateral APB or FDI or hand intrinsics. There is no swelling, color changes, allodynia or dystrophic changes. There is 5 out of 5 strength in the bilateral wrist extension, finger abduction and long finger flexion. There is intact sensation to light touch in all dermatomal and peripheral nerve distributions. There is a negative Phalen's test bilaterally. There is a negative Hoffmann's test bilaterally.  Does have some painful range of motion of the left shoulder.  Skin:    General: Skin is warm and dry.     Findings: No erythema or rash.  Neurological:     General: No focal deficit present.     Mental Status: She is alert and oriented to person, place, and time.     Sensory: No sensory deficit.     Motor: No weakness or abnormal muscle tone.     Coordination: Coordination normal.     Gait: Gait normal.  Psychiatric:        Mood and Affect: Mood normal.        Behavior: Behavior normal.      Imaging: No results found.

## 2022-06-03 ENCOUNTER — Ambulatory Visit (INDEPENDENT_AMBULATORY_CARE_PROVIDER_SITE_OTHER): Payer: Medicare Other | Admitting: Sports Medicine

## 2022-06-03 DIAGNOSIS — Z981 Arthrodesis status: Secondary | ICD-10-CM | POA: Diagnosis not present

## 2022-06-03 DIAGNOSIS — M19012 Primary osteoarthritis, left shoulder: Secondary | ICD-10-CM

## 2022-06-03 DIAGNOSIS — M792 Neuralgia and neuritis, unspecified: Secondary | ICD-10-CM

## 2022-06-03 NOTE — Progress Notes (Signed)
EMG follow up.

## 2022-06-03 NOTE — Progress Notes (Signed)
Dana Holder - 67 y.o. female MRN [id number]  Date of birth: 12-03-1955  Office Visit Note: Visit Date: 06/03/2022 PCP: Elfredia Nevins, MD Referred by: Elfredia Nevins, MD  Subjective: Chief Complaint  Patient presents with   Left Shoulder - Pain   HPI: Dana Holder is a pleasant 67 y.o. female who presents today for follow-up of left shoulder pain and LUE radicular symptoms.  She is status post 4 level cervical fusion with ACDF from C3-C7 on 05/06/2021, performed by Dr. Wynetta Emery.  Per patient she has been doing well since then until she had a fall where she landed with both arms extended, this exacerbated her left shoulder pain.   On 04/29/2022 we did proceed with ultrasound-guided glenohumeral joint injection which did help somewhat with her shoulder pain but only lasted a few weeks and then the pain has returned.  She is having some burning and tingling sensation from the lateral shoulder down to the distal wrist.  Because of this we obtained nerve conduction study/EMG with Dr. Alvester Morin on 5/124, essentially showed mild median nerve entrapment at the wrist indicative of carpal tunnel syndrome, otherwise was within normal limits.  She is starting therapy this following Thursday.  Pertinent ROS were reviewed with the patient and found to be negative unless otherwise specified above in HPI.   Assessment & Plan: Visit Diagnoses:  1. Primary osteoarthritis, left shoulder   2. Radicular pain in left arm   3. History of fusion of cervical spine    Plan: Discussed with [name] the nature of her left shoulder pain as well as her radicular symptoms.  She certainly has moderate to severe osteoarthritis of the left shoulder joint which was exacerbated by her fall.  I think more of her pain is emanating from this.  She still has some radicular symptoms down the arm, but her NCS/EMG did not reveal any gross brachial plexopathy or cervical radiculopathy.  We will get her started in physical therapy  for the shoulder as well as her neck and have her follow-up back with Dr. Magnus Ivan in about 1 month.  She is going to start taking Tylenol.  If her pain improves but she is still having some radicular symptoms, may consider a trial of gabapentin and/or seeing Dr. Wynetta Emery back.  Follow-up with Dr. Magnus Ivan as indicated.  Follow-up: Return for with Dr. Magnus Ivan in one month.   Meds & Orders: No orders of the defined types were placed in this encounter.  No orders of the defined types were placed in this encounter.    Procedures: No procedures performed      Clinical History: MRI left shoulder.    IMPRESSION: 1. Mild tendinosis of the supraspinatus, infraspinatus and subscapularis tendons. 2. Mild tendinosis of the intra-articular portion of the long head of the biceps tendon. 3. Moderate-severe osteoarthritis of the glenohumeral joint.     Electronically Signed   By: Elige Ko M.D.   On: 03/13/2022 06:43  She reports that she quit smoking about 19 years ago. Her smoking use included cigarettes. She has a 15.00 pack-year smoking history. She has never used smokeless tobacco. No results for input(s): "HGBA1C", "LABURIC" in the last 8760 hours.  Objective:   Vital Signs: There were no vitals taken for this visit.  Physical Exam  Gen: Well-appearing, in no acute distress; non-toxic CV: Regular Rate. Well-perfused. Warm.  Resp: Breathing unlabored on room air; no wheezing. Psych: Fluid speech in conversation; appropriate affect; normal thought process Neuro: Sensation intact  throughout. No gross coordination deficits.   Ortho Exam Cervical: There is expected limited flexion and extension given her ACDF.  Mild left trapezius hypertonicity.  Negative Spurling's test bilaterally.   - Left shoulder: The shoulder joint itself moves rather fluidly with flexion and abduction, but restriction with ER and full abduction.  There is some pain with a bony block of external rotation at about 50  degrees.  There is no significant grinding or grating.  She has intact strength with all rotator cuff testing although some mild pain with resisted external rotation.  Imaging/Testing:  EMG 06/02/22: The above electrodiagnostic study is ABNORMAL and reveals evidence of a mild left median nerve entrapment at the wrist (carpal tunnel syndrome) affecting sensory components. There is no significant electrodiagnostic evidence of any other focal nerve entrapment, brachial plexopathy or cervical radiculopathy.   Past Medical/Family/Surgical/Social History: Medications & Allergies reviewed per EMR, new medications updated. Patient Active Problem List   Diagnosis Date Noted   Pain of left shoulder region 05/26/2022   History of adenomatous polyp of colon 09/29/2021   Spinal stenosis of cervical region 05/06/2021   Family history of pancreatic cancer    Family history of ovarian cancer    Family history of leukemia    Family history of lung cancer    Family history of throat cancer    Family history of brain cancer    Family history of stomach cancer    Family history of colon cancer    Family history of kidney cancer    Family history of prostate cancer    Spinal stenosis at L4-L5 level 07/21/2018   Constipation 11/17/2015   Special screening for malignant neoplasms, colon 11/17/2015   Past Medical History:  Diagnosis Date   Family history of brain cancer    Family history of colon cancer    Family history of kidney cancer    Family history of leukemia    Family history of lung cancer    Family history of ovarian cancer    Family history of pancreatic cancer    Family history of prostate cancer    Family history of stomach cancer    Family history of throat cancer    Gout    Hypercholesteremia    Hypercholesteremia    Hypothyroidism    Family History  Problem Relation Age of Onset   Acute myelogenous leukemia Son    Throat cancer Father 28   Ovarian cancer Sister 31   Lung  cancer Brother 29       non-small cell   Brain cancer Maternal Aunt        dx. in her 12s   Lung cancer Maternal Uncle        dx. in his 86s or 70s, smoker   Heart attack Maternal Grandmother    Pancreatic cancer Brother 44   Stomach cancer Maternal Aunt        dx. in her 71s   Colon cancer Cousin        dx. >50 - maternal cousin   Kidney cancer Cousin        dx. in his 45s - maternal cousin   Prostate cancer Cousin 49       maternal cousin   Anesthesia problems Neg Hx    Hypotension Neg Hx    Malignant hyperthermia Neg Hx    Pseudochol deficiency Neg Hx    Past Surgical History:  Procedure Laterality Date   ANTERIOR CERVICAL DECOMPRESSION/DISCECTOMY FUSION 4 LEVELS N/A  05/06/2021   Procedure: CERVICAL THREE-FOUR, CERVICAL FOUR-FIVE, CERVICAL FIVE-SIX, CERVICAL SIX-SEVEN ANTERIOR CERVICAL DECOMPRESSION/DISCECTOMY FUSION;  Surgeon: Donalee Citrin, MD;  Location: [facility] OR;  Service: Neurosurgery;  Laterality: N/A;   BACK SURGERY  2002   lumbar disc, two surgeries   COLONOSCOPY N/A 12/08/2015   Procedure: COLONOSCOPY;  Surgeon: West Bali, MD;  Location: [facility];  Service: Endoscopy;  Laterality: N/A;  9:15 AM   COLONOSCOPY WITH PROPOFOL N/A 11/18/2021   Procedure: COLONOSCOPY WITH PROPOFOL;  Surgeon: Lanelle Bal, DO;  Location: [facility];  Service: Endoscopy;  Laterality: N/A;  10:15 am   POLYPECTOMY  11/18/2021   Procedure: POLYPECTOMY;  Surgeon: Lanelle Bal, DO;  Location: [facility];  Service: Endoscopy;;   TUBAL LIGATION     Social History   Occupational History   Not on file  Tobacco Use   Smoking status: Former    Packs/day: 0.50    Years: 30.00    Additional pack years: 0.00    Total pack years: 15.00    Types: Cigarettes    Quit date: 06/21/2002    Years since quitting: 19.9   Smokeless tobacco: Never  Vaping Use   Vaping Use: Never used  Substance and Sexual Activity   Alcohol use: No   Drug use: No   Sexual activity: Yes    Birth  control/protection: Post-menopausal

## 2022-06-10 ENCOUNTER — Encounter: Payer: Self-pay | Admitting: Rehabilitative and Restorative Service Providers"

## 2022-06-10 ENCOUNTER — Ambulatory Visit (INDEPENDENT_AMBULATORY_CARE_PROVIDER_SITE_OTHER): Payer: Medicare Other | Admitting: Rehabilitative and Restorative Service Providers"

## 2022-06-10 DIAGNOSIS — G8929 Other chronic pain: Secondary | ICD-10-CM

## 2022-06-10 DIAGNOSIS — M6281 Muscle weakness (generalized): Secondary | ICD-10-CM | POA: Diagnosis not present

## 2022-06-10 DIAGNOSIS — M25512 Pain in left shoulder: Secondary | ICD-10-CM

## 2022-06-10 DIAGNOSIS — M25612 Stiffness of left shoulder, not elsewhere classified: Secondary | ICD-10-CM

## 2022-06-10 NOTE — Therapy (Signed)
OUTPATIENT PHYSICAL THERAPY SHOULDER EVALUATION     Patient Name: Dana Holder MRN: [id number] DOB:1955/03/24, 67 y.o., female Today's Date: 06/10/2022   END OF SESSION:         Past Medical History:  Diagnosis Date   Family history of brain cancer     Family history of colon cancer     Family history of kidney cancer     Family history of leukemia     Family history of lung cancer     Family history of ovarian cancer     Family history of pancreatic cancer     Family history of prostate cancer     Family history of stomach cancer     Family history of throat cancer     Gout     Hypercholesteremia     Hypercholesteremia     Hypothyroidism           Past Surgical History:  Procedure Laterality Date   ANTERIOR CERVICAL DECOMPRESSION/DISCECTOMY FUSION 4 LEVELS N/A 05/06/2021    Procedure: CERVICAL THREE-FOUR, CERVICAL FOUR-FIVE, CERVICAL FIVE-SIX, CERVICAL SIX-SEVEN ANTERIOR CERVICAL DECOMPRESSION/DISCECTOMY FUSION;  Surgeon: Donalee Citrin, MD;  Location: [facility] OR;  Service: Neurosurgery;  Laterality: N/A;   BACK SURGERY   2002    lumbar disc, two surgeries   COLONOSCOPY N/A 12/08/2015    Procedure: COLONOSCOPY;  Surgeon: West Bali, MD;  Location: [facility];  Service: Endoscopy;  Laterality: N/A;  9:15 AM   COLONOSCOPY WITH PROPOFOL N/A 11/18/2021    Procedure: COLONOSCOPY WITH PROPOFOL;  Surgeon: Lanelle Bal, DO;  Location: [facility];  Service: Endoscopy;  Laterality: N/A;  10:15 am   POLYPECTOMY   11/18/2021    Procedure: POLYPECTOMY;  Surgeon: Lanelle Bal, DO;  Location: [facility];  Service: Endoscopy;;   TUBAL LIGATION            Patient Active Problem List    Diagnosis Date Noted   Pain of left shoulder region 05/26/2022   History of adenomatous polyp of colon 09/29/2021   Spinal stenosis of cervical region 05/06/2021   Family history of pancreatic cancer     Family history of ovarian cancer     Family history of leukemia     Family  history of lung cancer     Family history of throat cancer     Family history of brain cancer     Family history of stomach cancer     Family history of colon cancer     Family history of kidney cancer     Family history of prostate cancer     Spinal stenosis at L4-L5 level 07/21/2018   Constipation 11/17/2015   Special screening for malignant neoplasms, colon 11/17/2015      PCP: Elfredia Nevins, MD    REFERRING PROVIDER: Madelyn Brunner, DO    REFERRING DIAG: [phone] (ICD-10-CM) - Chronic left shoulder pain    THERAPY DIAG:  No diagnosis found.   Rationale for Evaluation and Treatment: Rehabilitation   ONSET DATE: Chronic shoulder pain with a fall 4-5 months ago.   SUBJECTIVE:  SUBJECTIVE STATEMENT: [name] reports a previous cervical fusion of C3-7 and chronic left shoulder pain.  She had a fall early this year exacerbating the shoulder.  2 previous cortisone shots gave her little relief.   PERTINENT HISTORY: ACDF from C3-C7 on 05/06/2021, moderate to severe OA in Lt shoulder and RTC tendinosis, back surgery 2002   PAIN:  Are you having pain? Yes: NPRS scale: 5-9/10 Pain location: Left shoulder Pain description: Just hurts, constant, can get sharp jabs Aggravating factors: Left shoulder use Relieving factors: Sleep   PRECAUTIONS: None   WEIGHT BEARING RESTRICTIONS: No   FALLS:  Has patient fallen in last 6 months? Yes. Number of falls 1   LIVING ENVIRONMENT: Lives with: lives with their family and lives with their spouse Lives in: House/apartment Stairs: OK with stairs Has following equipment at home:  Has a walker but does not need it.  Shower bars and chair and doesn't need them.   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 Alvester Morin on 06/02/22, essentially showed mild median nerve entrapment at the wrist indicative of carpal tunnel syndrome, otherwise was within normal limits."  Moderate to severe left shoulder OA.   MRI left shoulder.   IMPRESSION: 1. Mild tendinosis of the supraspinatus, infraspinatus and subscapularis tendons. 2. Mild tendinosis of the intra-articular portion of the long head of the biceps tendon. 3. Moderate-severe osteoarthritis of the glenohumeral joint   PATIENT SURVEYS:  FOTO 51 (Goal 67 in 12 visits)   COGNITION: Overall cognitive status: Within functional limits for tasks assessed                                  SENSATION: No complaints of new peripheral pain or paresthesias   POSTURE: Significant for forward head, internally rotated and protracted shoulders   UPPER EXTREMITY ROM:    Passive ROM Left/Right 06/10/2022   Shoulder flexion 135/160     Shoulder extension      Shoulder abduction      Shoulder horizontal adduction 35 /30    Shoulder internal rotation 35/40     Shoulder external rotation 90/100     Elbow flexion      Elbow extension      Wrist flexion      Wrist extension      Wrist ulnar deviation      Wrist radial deviation      Wrist pronation      Wrist supination      (Blank rows = not tested)   UPPER EXTREMITY MMT:   MMT Left/Right 06/10/2022   Shoulder flexion      Shoulder extension      Shoulder abduction      Shoulder adduction      Shoulder internal rotation 9.3 /33.0    Shoulder external rotation 7.6/20.5     Middle trapezius      Lower trapezius      Elbow flexion      Elbow extension      Wrist flexion      Wrist extension      Wrist ulnar deviation      Wrist radial deviation      Wrist pronation      Wrist supination      Grip strength (lbs)      (  Blank rows = not tested)   TODAY'S TREATMENT:  Supine arm raises 20 x 3 seconds Shoulder  blade pinches 5 x 5 seconds Shouler ER/IR Thera-Band green 10 times each direction   PATIENT EDUCATION: Education details: HEP, PT plan of care Person educated: Patient Education method: Explanation, Demonstration, Verbal cues, and Handouts Education comprehension: verbalized understanding and needs further education     HOME EXERCISE PROGRAM: [id number]   ASSESSMENT:   CLINICAL IMPRESSION: Patient referred to PT for chronic left shoulder pain and osteoarthritis.Brelee has impairments in active range of motion, strength and function that will benefit from supervised physical therapy.  If after 4 weeks of supervised physical therapy, her pain and function are not significantly improved, she will be referred back to Dr. Magnus Ivan for additional medical advice.   OBJECTIVE IMPAIRMENTS: decreased activity tolerance, decreased shoulder mobility, decreased ROM, decreased strength, impaired flexibility, impaired UE use, postural dysfunction, and pain.   ACTIVITY LIMITATIONS: reaching, lifting, carry,  cleaning, driving, and or occupation   PERSONAL FACTORS:  ACDF from C3-C7 on 05/06/2021, moderate to severe OA in Lt shoulder and RTC tendinosis, back surgery 2002 are also affecting patient's functional outcome.   REHAB POTENTIAL: Good   CLINICAL DECISION MAKING: Stable/uncomplicated   EVALUATION COMPLEXITY: Low       GOALS: Short term PT Goals Target date: 07/08/2022 Pt will be I and compliant with her day 1 HEP. Baseline: Started 06/10/2022 Goal status: New  Improve left shoulder AROM for flexion, IR, ER and horizontal adduction  Baseline: 135, 35, 90 and 35 degrees Goal status: New   Long term PT goals Target date: 08/06/2022 Pt will improve Lt shoulder AROM to 90% or better as compared to the uninvolved right to improve functional reaching Baseline: See objective Goal status: New  Pt will improve  Lt shoulder strength for ER to at least 15 pounds and IR to at least 20 pounds  Baseline:  7.6 and 9.3 pounds respectively Goal status: New  Pt will improve FOTO to at least 67% functional to show improved function Baseline: 51 Goal status: New  Pt will reduce pain to consistently less than 4/10 with usual activity and work activity. Baseline: 5 - 9 out of 10 Goal status: New  5.  Sybel will be independent with her long-term home exercise program at discharge  Baseline: Started 06/10/2022  Goal status: New   PLAN: PT FREQUENCY: 1-2 times per week    PT DURATION: 8 weeks   PLANNED INTERVENTIONS (unless contraindicated): aquatic PT, Canalith repositioning, cryotherapy, Electrical stimulation, Iontophoresis with 4 mg/ml dexamethasome, Moist heat, traction, Ultrasound, gait training, Therapeutic exercise, balance training, neuromuscular re-education, patient/family education, prosthetic training, manual techniques, passive ROM, dry needling, taping, vasopnuematic device, vestibular, spinal manipulations, joint manipulations   PLAN FOR NEXT SESSION: Review HEP.  Emphasis on scapular and rotator cuff strengthening with appropriate active range of motion activities while avoiding impingement.  Cherlyn Cushing PT, MPT

## 2022-06-22 ENCOUNTER — Ambulatory Visit (INDEPENDENT_AMBULATORY_CARE_PROVIDER_SITE_OTHER): Payer: Medicare Other | Admitting: Physical Therapy

## 2022-06-22 ENCOUNTER — Encounter: Payer: Self-pay | Admitting: Physical Therapy

## 2022-06-22 DIAGNOSIS — M25612 Stiffness of left shoulder, not elsewhere classified: Secondary | ICD-10-CM | POA: Diagnosis not present

## 2022-06-22 DIAGNOSIS — G8929 Other chronic pain: Secondary | ICD-10-CM

## 2022-06-22 DIAGNOSIS — M6281 Muscle weakness (generalized): Secondary | ICD-10-CM

## 2022-06-22 DIAGNOSIS — M25512 Pain in left shoulder: Secondary | ICD-10-CM | POA: Diagnosis not present

## 2022-06-22 NOTE — Therapy (Signed)
OUTPATIENT PHYSICAL THERAPY SHOULDER TREATMENT NOTE     Patient Name: Dana Holder MRN: [id number] DOB:1955/03/27, 67 y.o., female Today's Date: 06/10/2022   END OF SESSION:         Past Medical History:  Diagnosis Date   Family history of brain cancer     Family history of colon cancer     Family history of kidney cancer     Family history of leukemia     Family history of lung cancer     Family history of ovarian cancer     Family history of pancreatic cancer     Family history of prostate cancer     Family history of stomach cancer     Family history of throat cancer     Gout     Hypercholesteremia     Hypercholesteremia     Hypothyroidism           Past Surgical History:  Procedure Laterality Date   ANTERIOR CERVICAL DECOMPRESSION/DISCECTOMY FUSION 4 LEVELS N/A 05/06/2021    Procedure: CERVICAL THREE-FOUR, CERVICAL FOUR-FIVE, CERVICAL FIVE-SIX, CERVICAL SIX-SEVEN ANTERIOR CERVICAL DECOMPRESSION/DISCECTOMY FUSION;  Surgeon: Donalee Citrin, MD;  Location: [facility] OR;  Service: Neurosurgery;  Laterality: N/A;   BACK SURGERY   2002    lumbar disc, two surgeries   COLONOSCOPY N/A 12/08/2015    Procedure: COLONOSCOPY;  Surgeon: West Bali, MD;  Location: [facility];  Service: Endoscopy;  Laterality: N/A;  9:15 AM   COLONOSCOPY WITH PROPOFOL N/A 11/18/2021    Procedure: COLONOSCOPY WITH PROPOFOL;  Surgeon: Lanelle Bal, DO;  Location: [facility];  Service: Endoscopy;  Laterality: N/A;  10:15 am   POLYPECTOMY   11/18/2021    Procedure: POLYPECTOMY;  Surgeon: Lanelle Bal, DO;  Location: [facility];  Service: Endoscopy;;   TUBAL LIGATION            Patient Active Problem List    Diagnosis Date Noted   Pain of left shoulder region 05/26/2022   History of adenomatous polyp of colon 09/29/2021   Spinal stenosis of cervical region 05/06/2021   Family history of pancreatic cancer     Family history of ovarian cancer     Family history of leukemia     Family  history of lung cancer     Family history of throat cancer     Family history of brain cancer     Family history of stomach cancer     Family history of colon cancer     Family history of kidney cancer     Family history of prostate cancer     Spinal stenosis at L4-L5 level 07/21/2018   Constipation 11/17/2015   Special screening for malignant neoplasms, colon 11/17/2015      PCP: Elfredia Nevins, MD    REFERRING PROVIDER: Madelyn Brunner, DO    REFERRING DIAG: [phone] (ICD-10-CM) - Chronic left shoulder pain    THERAPY DIAG:  No diagnosis found.   Rationale for Evaluation and Treatment: Rehabilitation   ONSET DATE: Chronic shoulder pain with a fall 4-5 months ago.   SUBJECTIVE:  SUBJECTIVE STATEMENT: Pt arriving today reporting fall on Saturday when she was walking her dogs. Pt reporting she fell on outstretched arms. Pt also reporting on-going "brain fog" which she relates to having Covid the 2nd time. Pt reporting 9/10 pain in her left shoulder.    PERTINENT HISTORY: ACDF from C3-C7 on 05/06/2021, moderate to severe OA in Lt shoulder and RTC tendinosis, back surgery 2002   PAIN:  Are you having pain? Yes: NPRS scale: 9/10 Pain location: Left shoulder Pain description: Just hurts, constant, can get sharp jabs Aggravating factors: Left shoulder use Relieving factors: Sleep   PRECAUTIONS: None   WEIGHT BEARING RESTRICTIONS: No   FALLS:  Has patient fallen in last 6 months? Yes. Number of falls 1   LIVING ENVIRONMENT: Lives with: lives with their family and lives with their spouse Lives in: House/apartment Stairs: OK with stairs Has following equipment at home:  Has a walker but does not need it.  Shower bars and chair and doesn't need them.   OCCUPATION: Retired   PLOF:  Independent   PATIENT GOALS:Maintain house and works with her church   NEXT MD VISIT: 07/21/2022   OBJECTIVE:    DIAGNOSTIC FINDINGS:  "EMG with Dr. Alvester Morin on 06/02/22, essentially showed mild median nerve entrapment at the wrist indicative of carpal tunnel syndrome, otherwise was within normal limits."  Moderate to severe left shoulder OA.   MRI left shoulder.   IMPRESSION: 1. Mild tendinosis of the supraspinatus, infraspinatus and subscapularis tendons. 2. Mild tendinosis of the intra-articular portion of the long head of the biceps tendon. 3. Moderate-severe osteoarthritis of the glenohumeral joint   PATIENT SURVEYS:  06/10/22: FOTO 51 (Goal 67 in 12 visits)   COGNITION: Overall cognitive status: Within functional limits for tasks assessed                                  SENSATION: No complaints of new peripheral pain or paresthesias   POSTURE: Significant for forward head, internally rotated and protracted shoulders   UPPER EXTREMITY ROM:    Passive ROM Left/Right 06/10/2022   Shoulder flexion 135/160     Shoulder extension      Shoulder abduction      Shoulder horizontal adduction 35 /30    Shoulder internal rotation 35/40     Shoulder external rotation 90/100     Elbow flexion      Elbow extension      Wrist flexion      Wrist extension      Wrist ulnar deviation      Wrist radial deviation      Wrist pronation      Wrist supination      (Blank rows = not tested)   UPPER EXTREMITY MMT:   MMT Left/Right 06/10/2022   Shoulder flexion      Shoulder extension      Shoulder abduction      Shoulder adduction      Shoulder internal rotation 9.3 /33.0    Shoulder external rotation 7.6/20.5     Middle trapezius      Lower trapezius      Elbow flexion      Elbow extension      Wrist flexion      Wrist extension      Wrist ulnar deviation      Wrist radial deviation      Wrist pronation  Wrist supination      Grip strength (lbs)      (Blank rows = not  tested)   TODAY'S TREATMENT:  06/22/22:  TherEx:  Pulleys flexion: x 3 minutes  UE ranger sitting: flexion, circles both direction x 1 minute Seated ball squeezes  c bil UE x 15 holding 5 sec Seated: bil ER using Level 2 band x 15  Supine AAROM flexion: x 20 c 1# bar Supine left  ARROM ER x 15 c 1# bar Manual: Grade 2-3 AP GH mobs in left shoulder    06/10/22:  Supine arm raises 20 x 3 seconds Shoulder blade pinches 5 x 5 seconds Shouler ER/IR Thera-Band green 10 times each direction   PATIENT EDUCATION: Education details: HEP, PT plan of care Person educated: Patient Education method: Explanation, Demonstration, Verbal cues, and Handouts Education comprehension: verbalized understanding and needs further education     HOME EXERCISE PROGRAM: [id number]   ASSESSMENT:   CLINICAL IMPRESSION:  Pt reporting increased pain today following a fall this weekend when walking her dog. Pt tolerating all exercises well. Continue skilled Pt to maximize pt's function.     OBJECTIVE IMPAIRMENTS: decreased activity tolerance, decreased shoulder mobility, decreased ROM, decreased strength, impaired flexibility, impaired UE use, postural dysfunction, and pain.   ACTIVITY LIMITATIONS: reaching, lifting, carry,  cleaning, driving, and or occupation   PERSONAL FACTORS:  ACDF from C3-C7 on 05/06/2021, moderate to severe OA in Lt shoulder and RTC tendinosis, back surgery 2002 are also affecting patient's functional outcome.   REHAB POTENTIAL: Good   CLINICAL DECISION MAKING: Stable/uncomplicated   EVALUATION COMPLEXITY: Low       GOALS: Short term PT Goals Target date: 07/08/2022 Pt will be I and compliant with her day 1 HEP. Baseline: Started 06/10/2022 Goal status: New  Improve left shoulder AROM for flexion, IR, ER and horizontal adduction  Baseline: 135, 35, 90 and 35 degrees Goal status: New   Long term PT goals Target date: 08/06/2022 Pt will improve Lt shoulder AROM to 90% or better  as compared to the uninvolved right to improve functional reaching Baseline: See objective Goal status: New  Pt will improve  Lt shoulder strength for ER to at least 15 pounds and IR to at least 20 pounds  Baseline: 7.6 and 9.3 pounds respectively Goal status: New  Pt will improve FOTO to at least 67% functional to show improved function Baseline: 51 Goal status: New  Pt will reduce pain to consistently less than 4/10 with usual activity and work activity. Baseline: 5 - 9 out of 10 Goal status: New  5.  Dana Holder will be independent with her long-term home exercise program at discharge  Baseline: Started 06/10/2022  Goal status: New   PLAN: PT FREQUENCY: 1-2 times per week    PT DURATION: 8 weeks   PLANNED INTERVENTIONS (unless contraindicated): aquatic PT, Canalith repositioning, cryotherapy, Electrical stimulation, Iontophoresis with 4 mg/ml dexamethasome, Moist heat, traction, Ultrasound, gait training, Therapeutic exercise, balance training, neuromuscular re-education, patient/family education, prosthetic training, manual techniques, passive ROM, dry needling, taping, vasopnuematic device, vestibular, spinal manipulations, joint manipulations   PLAN FOR NEXT SESSION: Emphasis on scapular and rotator cuff strengthening with appropriate active range of motion activities while avoiding impingement.  Narda Amber, PT, MPT 06/22/22 12:26 PM

## 2022-06-25 ENCOUNTER — Encounter: Payer: Self-pay | Admitting: Rehabilitative and Restorative Service Providers"

## 2022-06-25 ENCOUNTER — Ambulatory Visit (INDEPENDENT_AMBULATORY_CARE_PROVIDER_SITE_OTHER): Payer: Medicare Other | Admitting: Rehabilitative and Restorative Service Providers"

## 2022-06-25 DIAGNOSIS — G8929 Other chronic pain: Secondary | ICD-10-CM | POA: Diagnosis not present

## 2022-06-25 DIAGNOSIS — M6281 Muscle weakness (generalized): Secondary | ICD-10-CM | POA: Diagnosis not present

## 2022-06-25 DIAGNOSIS — M25612 Stiffness of left shoulder, not elsewhere classified: Secondary | ICD-10-CM

## 2022-06-25 DIAGNOSIS — M25512 Pain in left shoulder: Secondary | ICD-10-CM | POA: Diagnosis not present

## 2022-06-25 NOTE — Therapy (Signed)
OUTPATIENT PHYSICAL THERAPY SHOULDER TREATMENT NOTE     Patient Name: Dana Holder MRN: [id number] DOB:1955-11-19, 67 y.o., female Today's Date: 06/10/2022   END OF SESSION:         Past Medical History:  Diagnosis Date   Family history of brain cancer     Family history of colon cancer     Family history of kidney cancer     Family history of leukemia     Family history of lung cancer     Family history of ovarian cancer     Family history of pancreatic cancer     Family history of prostate cancer     Family history of stomach cancer     Family history of throat cancer     Gout     Hypercholesteremia     Hypercholesteremia     Hypothyroidism           Past Surgical History:  Procedure Laterality Date   ANTERIOR CERVICAL DECOMPRESSION/DISCECTOMY FUSION 4 LEVELS N/A 05/06/2021    Procedure: CERVICAL THREE-FOUR, CERVICAL FOUR-FIVE, CERVICAL FIVE-SIX, CERVICAL SIX-SEVEN ANTERIOR CERVICAL DECOMPRESSION/DISCECTOMY FUSION;  Surgeon: Donalee Citrin, MD;  Location: [facility] OR;  Service: Neurosurgery;  Laterality: N/A;   BACK SURGERY   2002    lumbar disc, two surgeries   COLONOSCOPY N/A 12/08/2015    Procedure: COLONOSCOPY;  Surgeon: West Bali, MD;  Location: [facility];  Service: Endoscopy;  Laterality: N/A;  9:15 AM   COLONOSCOPY WITH PROPOFOL N/A 11/18/2021    Procedure: COLONOSCOPY WITH PROPOFOL;  Surgeon: Lanelle Bal, DO;  Location: [facility];  Service: Endoscopy;  Laterality: N/A;  10:15 am   POLYPECTOMY   11/18/2021    Procedure: POLYPECTOMY;  Surgeon: Lanelle Bal, DO;  Location: [facility];  Service: Endoscopy;;   TUBAL LIGATION            Patient Active Problem List    Diagnosis Date Noted   Pain of left shoulder region 05/26/2022   History of adenomatous polyp of colon 09/29/2021   Spinal stenosis of cervical region 05/06/2021   Family history of pancreatic cancer     Family history of ovarian cancer     Family history of leukemia     Family  history of lung cancer     Family history of throat cancer     Family history of brain cancer     Family history of stomach cancer     Family history of colon cancer     Family history of kidney cancer     Family history of prostate cancer     Spinal stenosis at L4-L5 level 07/21/2018   Constipation 11/17/2015   Special screening for malignant neoplasms, colon 11/17/2015      PCP: Elfredia Nevins, MD    REFERRING PROVIDER: Madelyn Brunner, DO    REFERRING DIAG: [phone] (ICD-10-CM) - Chronic left shoulder pain    THERAPY DIAG:  No diagnosis found.   Rationale for Evaluation and Treatment: Rehabilitation   ONSET DATE: Chronic shoulder pain with a fall 4-5 months ago.   SUBJECTIVE:  SUBJECTIVE STATEMENT: [name] reports minimal home exercise program compliance since evaluation.  We spent a lot of time today reviewing her basic exercises and correcting technique.   PERTINENT HISTORY: ACDF from C3-C7 on 05/06/2021, moderate to severe OA in Lt shoulder and RTC tendinosis, back surgery 2002   PAIN:  Are you having pain? Yes: NPRS scale: As high as 9/10 Pain location: Left shoulder Pain description: Just hurts, constant, can get sharp jabs Aggravating factors: Left shoulder use Relieving factors: Sleep   PRECAUTIONS: None   WEIGHT BEARING RESTRICTIONS: No   FALLS:  Has patient fallen in last 6 months? Yes. Number of falls 1   LIVING ENVIRONMENT: Lives with: lives with their family and lives with their spouse Lives in: House/apartment Stairs: OK with stairs Has following equipment at home:  Has a walker but does not need it.  Shower bars and chair and doesn't need them.   OCCUPATION: Retired   PLOF: Independent   PATIENT GOALS:Maintain house and works with her church   NEXT MD VISIT:  07/21/2022   OBJECTIVE:    DIAGNOSTIC FINDINGS:  "EMG with Dr. Alvester Morin on 06/02/22, essentially showed mild median nerve entrapment at the wrist indicative of carpal tunnel syndrome, otherwise was within normal limits."  Moderate to severe left shoulder OA.   MRI left shoulder.   IMPRESSION: 1. Mild tendinosis of the supraspinatus, infraspinatus and subscapularis tendons. 2. Mild tendinosis of the intra-articular portion of the long head of the biceps tendon. 3. Moderate-severe osteoarthritis of the glenohumeral joint   PATIENT SURVEYS:  06/10/22: FOTO 51 (Goal 67 in 12 visits)   COGNITION: Overall cognitive status: Within functional limits for tasks assessed                                  SENSATION: No complaints of new peripheral pain or paresthesias   POSTURE: Significant for forward head, internally rotated and protracted shoulders   UPPER EXTREMITY ROM:    Passive ROM Left/Right 06/10/2022   Shoulder flexion 135/160     Shoulder extension      Shoulder abduction      Shoulder horizontal adduction 35 /30    Shoulder internal rotation 35/40     Shoulder external rotation 90/100     Elbow flexion      Elbow extension      Wrist flexion      Wrist extension      Wrist ulnar deviation      Wrist radial deviation      Wrist pronation      Wrist supination      (Blank rows = not tested)   UPPER EXTREMITY MMT:   MMT Left/Right 06/10/2022   Shoulder flexion      Shoulder extension      Shoulder abduction      Shoulder adduction      Shoulder internal rotation 9.3 /33.0    Shoulder external rotation 7.6/20.5     Middle trapezius      Lower trapezius      Elbow flexion      Elbow extension      Wrist flexion      Wrist extension      Wrist ulnar deviation      Wrist radial deviation      Wrist pronation      Wrist supination      Grip strength (lbs)      (Blank rows =  not tested)   TODAY'S TREATMENT:  06/25/2022: Supine arm raises 2 sets of 20 x 3 seconds with  3# Shoulder blade pinches 3 sets of 5 x 5 seconds Shouler ER/IR Thera-Band green 4 sets of 10 times each direction Supine IR stretch 10X 10 seconds with PT assist Supine shoulder flexion (protract 1st, palm facing in) 10X 5 seconds  Review of exam findings and of the importance of consistent HEP compliance   06/22/22:  TherEx:  Pulleys flexion: x 3 minutes  UE ranger sitting: flexion, circles both direction x 1 minute Seated ball squeezes  c bil UE x 15 holding 5 sec Seated: bil ER using Level 2 band x 15  Supine AAROM flexion: x 20 c 1# bar Supine left  ARROM ER x 15 c 1# bar Manual: Grade 2-3 AP GH mobs in left shoulder    06/10/22:  Supine arm raises 20 x 3 seconds Shoulder blade pinches 5 x 5 seconds Shouler ER/IR Thera-Band green 10 times each direction   PATIENT EDUCATION: Education details: HEP, PT plan of care Person educated: Patient Education method: Explanation, Demonstration, Verbal cues, and Handouts Education comprehension: verbalized understanding and needs further education     HOME EXERCISE PROGRAM: [email or web address]   ASSESSMENT:   CLINICAL IMPRESSION:  Deniz reports very poor early home exercise program compliance.  I reminded her of the importance of consistent exercise at home to improve her active range of motion, strength and shoulder function.  She appears to have a better understanding and mentioned she would do a better job at home.  With adequate home program compliance and attendance in rehabilitation, Brei's prognosis to improve her shoulder active range of motion, strength and function is good.  Even with supervised physical therapy, Kailena may be a reverse shoulder replacement candidate but she should be in better shape with pre-operative rehabilitation.  We will reassess in the next 2 to 3 weeks with updated active range of motion and strength assessments to make additional recommendations.    OBJECTIVE IMPAIRMENTS: decreased activity tolerance,  decreased shoulder mobility, decreased ROM, decreased strength, impaired flexibility, impaired UE use, postural dysfunction, and pain.   ACTIVITY LIMITATIONS: reaching, lifting, carry,  cleaning, driving, and or occupation   PERSONAL FACTORS:  ACDF from C3-C7 on 05/06/2021, moderate to severe OA in Lt shoulder and RTC tendinosis, back surgery 2002 are also affecting patient's functional outcome.   REHAB POTENTIAL: Good   CLINICAL DECISION MAKING: Stable/uncomplicated   EVALUATION COMPLEXITY: Low       GOALS: Short term PT Goals Target date: 07/08/2022 Pt will be I and compliant with her day 1 HEP. Baseline: Started 06/10/2022 Goal status: No met 06/25/2022  Improve left shoulder AROM for flexion, IR, ER and horizontal adduction  Baseline: 135, 35, 90 and 35 degrees Goal status: New   Long term PT goals Target date: 08/06/2022 Pt will improve Lt shoulder AROM to 90% or better as compared to the uninvolved right to improve functional reaching Baseline: See objective Goal status: New  Pt will improve  Lt shoulder strength for ER to at least 15 pounds and IR to at least 20 pounds  Baseline: 7.6 and 9.3 pounds respectively Goal status: New  Pt will improve FOTO to at least 67% functional to show improved function Baseline: 51 Goal status: New  Pt will reduce pain to consistently less than 4/10 with usual activity and work activity. Baseline: 5 - 9 out of 10 Goal status: New  5.  Baylen will be independent  with her long-term home exercise program at discharge  Baseline: Started 06/10/2022  Goal status: New   PLAN: PT FREQUENCY: 1-2 times per week    PT DURATION: 8 weeks   PLANNED INTERVENTIONS (unless contraindicated): aquatic PT, Canalith repositioning, cryotherapy, Electrical stimulation, Iontophoresis with 4 mg/ml dexamethasome, Moist heat, traction, Ultrasound, gait training, Therapeutic exercise, balance training, neuromuscular re-education, patient/family education, prosthetic  training, manual techniques, passive ROM, dry needling, taping, vasopnuematic device, vestibular, spinal manipulations, joint manipulations   PLAN FOR NEXT SESSION: Emphasis on scapular and rotator cuff strengthening with appropriate active range of motion activities while avoiding impingement.  Cherlyn Cushing, PT, MPT 06/25/22 3:41 PM

## 2022-06-29 ENCOUNTER — Encounter: Payer: Self-pay | Admitting: Rehabilitative and Restorative Service Providers"

## 2022-06-29 ENCOUNTER — Ambulatory Visit (INDEPENDENT_AMBULATORY_CARE_PROVIDER_SITE_OTHER): Payer: Medicare Other | Admitting: Rehabilitative and Restorative Service Providers"

## 2022-06-29 DIAGNOSIS — M6281 Muscle weakness (generalized): Secondary | ICD-10-CM | POA: Diagnosis not present

## 2022-06-29 DIAGNOSIS — G8929 Other chronic pain: Secondary | ICD-10-CM

## 2022-06-29 DIAGNOSIS — M25612 Stiffness of left shoulder, not elsewhere classified: Secondary | ICD-10-CM | POA: Diagnosis not present

## 2022-06-29 DIAGNOSIS — M25512 Pain in left shoulder: Secondary | ICD-10-CM

## 2022-06-29 NOTE — Therapy (Signed)
OUTPATIENT PHYSICAL THERAPY SHOULDER TREATMENT NOTE     Patient Name: Dana Holder MRN: [id number] DOB:[birth year], 67 y.o., female Today's Date: 06/10/2022   END OF SESSION:         Past Medical History:  Diagnosis Date   Family history of brain cancer     Family history of colon cancer     Family history of kidney cancer     Family history of leukemia     Family history of lung cancer     Family history of ovarian cancer     Family history of pancreatic cancer     Family history of prostate cancer     Family history of stomach cancer     Family history of throat cancer     Gout     Hypercholesteremia     Hypercholesteremia     Hypothyroidism           Past Surgical History:  Procedure Laterality Date   ANTERIOR CERVICAL DECOMPRESSION/DISCECTOMY FUSION 4 LEVELS N/A 05/06/2021    Procedure: CERVICAL THREE-FOUR, CERVICAL FOUR-FIVE, CERVICAL FIVE-SIX, CERVICAL SIX-SEVEN ANTERIOR CERVICAL DECOMPRESSION/DISCECTOMY FUSION;  Surgeon: Dana Citrin, MD;  Location: [facility] OR;  Service: Neurosurgery;  Laterality: N/A;   BACK SURGERY   2002    lumbar disc, two surgeries   COLONOSCOPY N/A 12/08/2015    Procedure: COLONOSCOPY;  Surgeon: Dana Bali, MD;  Location: [facility];  Service: Endoscopy;  Laterality: N/A;  9:15 AM   COLONOSCOPY WITH PROPOFOL N/A 11/18/2021    Procedure: COLONOSCOPY WITH PROPOFOL;  Surgeon: Dana Bal, DO;  Location: [facility];  Service: Endoscopy;  Laterality: N/A;  10:15 am   POLYPECTOMY   11/18/2021    Procedure: POLYPECTOMY;  Surgeon: Dana Bal, DO;  Location: [facility];  Service: Endoscopy;;   TUBAL LIGATION            Patient Active Problem List    Diagnosis Date Noted   Pain of left shoulder region 05/26/2022   History of adenomatous polyp of colon 09/29/2021   Spinal stenosis of cervical region 05/06/2021   Family history of pancreatic cancer     Family history of ovarian cancer     Family history of leukemia     Family  history of lung cancer     Family history of throat cancer     Family history of brain cancer     Family history of stomach cancer     Family history of colon cancer     Family history of kidney cancer     Family history of prostate cancer     Spinal stenosis at L4-L5 level 07/21/2018   Constipation 11/17/2015   Special screening for malignant neoplasms, colon 11/17/2015      PCP: Dana Nevins, MD    REFERRING PROVIDER: Madelyn Brunner, DO    REFERRING DIAG: [phone] (ICD-10-CM) - Chronic left shoulder pain    THERAPY DIAG:  No diagnosis found.   Rationale for Evaluation and Treatment: Rehabilitation   ONSET DATE: Chronic shoulder pain with a fall 4-5 months ago.   SUBJECTIVE:  SUBJECTIVE STATEMENT: Dana Holder reports better home exercise program compliance since Friday.  Compliance could still improve.  We again reviewed her basic exercises and corrected technique today.   PERTINENT HISTORY: ACDF from C3-C7 on 05/06/2021, moderate to severe OA in Lt shoulder and RTC tendinosis, back surgery 2002   PAIN:  Are you having pain? Yes: NPRS scale: As high as 7/10 (was 9/10) on a 10/10 Pain location: Left shoulder Pain description: Just hurts, constant, can get sharp jabs Aggravating factors: Left shoulder use, particularly reaching Relieving factors: Sleep   PRECAUTIONS: None   WEIGHT BEARING RESTRICTIONS: No   FALLS:  Has patient fallen in last 6 months? Yes. Number of falls 1   LIVING ENVIRONMENT: Lives with: lives with their family and lives with their spouse Lives in: House/apartment Stairs: OK with stairs Has following equipment at home:  Has a walker but does not need it.  Shower bars and chair and doesn't need them.   OCCUPATION: Retired   PLOF: Independent   PATIENT  GOALS:Maintain house and works with her church   NEXT MD VISIT: 07/21/2022   OBJECTIVE:    DIAGNOSTIC FINDINGS:  "EMG with Dr. Alvester Morin on 06/02/22, essentially showed mild median nerve entrapment at the wrist indicative of carpal tunnel syndrome, otherwise was within normal limits."  Moderate to severe left shoulder OA.   MRI left shoulder.   IMPRESSION: 1. Mild tendinosis of the supraspinatus, infraspinatus and subscapularis tendons. 2. Mild tendinosis of the intra-articular portion of the long head of the biceps tendon. 3. Moderate-severe osteoarthritis of the glenohumeral joint   PATIENT SURVEYS:  06/29/2022 FOTO next visit  06/10/22: FOTO 51 (Goal 67 in 12 visits)   COGNITION: Overall cognitive status: Within functional limits for tasks assessed                                  SENSATION: No complaints of new peripheral pain or paresthesias   POSTURE: Significant for forward head, internally rotated and protracted shoulders   UPPER EXTREMITY ROM:    Passive ROM Left/Right 06/10/2022 Left 06/29/2022  Shoulder flexion 135/160   150  Shoulder extension      Shoulder abduction      Shoulder horizontal adduction 35 /30  35  Shoulder internal rotation 35/40  50   Shoulder external rotation 90/100  90   Elbow flexion      Elbow extension      Wrist flexion      Wrist extension      Wrist ulnar deviation      Wrist radial deviation      Wrist pronation      Wrist supination      (Blank rows = not tested)   UPPER EXTREMITY MMT:   MMT Left/Right 06/10/2022 Left 06/29/2022  Shoulder flexion      Shoulder extension      Shoulder abduction      Shoulder adduction      Shoulder internal rotation 9.3 /33.0  23.6  Shoulder external rotation 7.6/20.5   17.5  Middle trapezius      Lower trapezius      Elbow flexion      Elbow extension      Wrist flexion      Wrist extension      Wrist ulnar deviation      Wrist radial deviation      Wrist pronation      Wrist supination  Grip strength (lbs)      (Blank rows = not tested)   TODAY'S TREATMENT:  06/29/2022: Supine arm raises 2 sets of 20 x 3 seconds with 3# Shoulder blade pinches 3 sets of 5 x 5 seconds Shoulder ER Thera-Band Blue 3 sets of 10 times each direction Shoulder IR Thera-Band Blue 2 sets of 10 times each direction Supine IR stretch 10X 10 seconds with PT assist Supine shoulder flexion (protract 1st, palm facing in) 10X 5 seconds  Review of new objective findings and of the importance of consistent HEP compliance   06/25/2022: Supine arm raises 2 sets of 20 x 3 seconds with 3# Shoulder blade pinches 3 sets of 5 x 5 seconds Shouler ER/IR Thera-Band green 4 sets of 10 times each direction Supine IR stretch 10X 10 seconds with PT assist Supine shoulder flexion (protract 1st, palm facing in) 10X 5 seconds  Review of exam findings and of the importance of consistent HEP compliance   06/22/22:  TherEx:  Pulleys flexion: x 3 minutes  UE ranger sitting: flexion, circles both direction x 1 minute Seated ball squeezes  c bil UE x 15 holding 5 sec Seated: bil ER using Level 2 band x 15  Supine AAROM flexion: x 20 c 1# bar Supine left  ARROM ER x 15 c 1# bar Manual: Grade 2-3 AP GH mobs in left shoulder    06/10/22:  Supine arm raises 20 x 3 seconds Shoulder blade pinches 5 x 5 seconds Shouler ER/IR Thera-Band green 10 times each direction   PATIENT EDUCATION: Education details: HEP, PT plan of care Person educated: Patient Education method: Explanation, Demonstration, Verbal cues, and Handouts Education comprehension: verbalized understanding and needs further education     HOME EXERCISE PROGRAM: [id number]   ASSESSMENT:   CLINICAL IMPRESSION:  Beth is making a significant early progress with her AROM and strength as assessed objectively today.  With consistent home exercise program compliance, she should meet long-term goals earlier than expected.  Early home exercise program  compliance has been inconsistent and will need to improve.   OBJECTIVE IMPAIRMENTS: decreased activity tolerance, decreased shoulder mobility, decreased ROM, decreased strength, impaired flexibility, impaired UE use, postural dysfunction, and pain.   ACTIVITY LIMITATIONS: reaching, lifting, carry,  cleaning, driving, and or occupation   PERSONAL FACTORS:  ACDF from C3-C7 on 05/06/2021, moderate to severe OA in Lt shoulder and RTC tendinosis, back surgery 2002 are also affecting patient's functional outcome.   REHAB POTENTIAL: Good   CLINICAL DECISION MAKING: Stable/uncomplicated   EVALUATION COMPLEXITY: Low       GOALS: Short term PT Goals Target date: 07/08/2022 Pt will be I and compliant with her day 1 HEP. Baseline: Started 06/10/2022 Goal status: Not met 06/29/2022  Improve left shoulder AROM for flexion, IR, ER and horizontal adduction  Baseline: 135, 35, 90 and 35 degrees Goal status: Met 06/29/2022   Long term PT goals Target date: 08/06/2022 Pt will improve Lt shoulder AROM to 90% or better as compared to the uninvolved right to improve functional reaching Baseline: See objective Goal status: On Going 06/29/2022  Pt will improve  Lt shoulder strength for ER to at least 15 pounds and IR to at least 20 pounds  Baseline: 7.6 and 9.3 pounds respectively Goal status: Met 06/29/2022  Pt will improve FOTO to at least 67% functional to show improved function Baseline: 51 Goal status: New  Pt will reduce pain to consistently less than 4/10 with usual activity and work activity. Baseline:  5 - 9 out of 10 Goal status: New  5.  Uarda will be independent with her long-term home exercise program at discharge  Baseline: Started 06/10/2022  Goal status: New   PLAN: PT FREQUENCY: 1-2 times per week    PT DURATION: 8 weeks   PLANNED INTERVENTIONS (unless contraindicated): aquatic PT, Canalith repositioning, cryotherapy, Electrical stimulation, Iontophoresis with 4 mg/ml dexamethasome,  Moist heat, traction, Ultrasound, gait training, Therapeutic exercise, balance training, neuromuscular re-education, patient/family education, prosthetic training, manual techniques, passive ROM, dry needling, taping, vasopnuematic device, vestibular, spinal manipulations, joint manipulations   PLAN FOR NEXT SESSION: Emphasis on scapular and rotator cuff strengthening with appropriate active range of motion activities while avoiding impingement.  FOTO next visit.  Cherlyn Cushing, PT, MPT 06/29/22 2:33 PM

## 2022-07-08 ENCOUNTER — Ambulatory Visit (INDEPENDENT_AMBULATORY_CARE_PROVIDER_SITE_OTHER): Payer: Medicare Other | Admitting: Rehabilitative and Restorative Service Providers"

## 2022-07-08 ENCOUNTER — Encounter: Payer: Self-pay | Admitting: Rehabilitative and Restorative Service Providers"

## 2022-07-08 DIAGNOSIS — M6281 Muscle weakness (generalized): Secondary | ICD-10-CM | POA: Diagnosis not present

## 2022-07-08 DIAGNOSIS — M25612 Stiffness of left shoulder, not elsewhere classified: Secondary | ICD-10-CM | POA: Diagnosis not present

## 2022-07-08 DIAGNOSIS — G8929 Other chronic pain: Secondary | ICD-10-CM

## 2022-07-08 DIAGNOSIS — M25512 Pain in left shoulder: Secondary | ICD-10-CM | POA: Diagnosis not present

## 2022-07-08 NOTE — Therapy (Addendum)
OUTPATIENT PHYSICAL THERAPY SHOULDER TREATMENT/PROGRESS NOTE   /DISCHARGE     Patient Name: Dana Holder MRN: [id number] DOB:[birth year], 67 y.o., female Today's Date: 06/10/2022   END OF SESSION:    07/08/22 1024  PT Visits / Re-Eval  Visit Number 5  Number of Visits 12  Date for PT Re-Evaluation 08/06/22  Authorization  Authorization Type Medicare/AARP  Authorization - Visit Number 5  Progress Note Due on Visit 10  PT Time Calculation  PT Start Time 1021  PT Stop Time 1100  PT Time Calculation (min) 39 min  PT - End of Session  Activity Tolerance Patient tolerated treatment well;No increased pain;Patient limited by fatigue  Behavior During Therapy [facility] for tasks assessed/performed          Past Medical History:  Diagnosis Date   Family history of brain cancer     Family history of colon cancer     Family history of kidney cancer     Family history of leukemia     Family history of lung cancer     Family history of ovarian cancer     Family history of pancreatic cancer     Family history of prostate cancer     Family history of stomach cancer     Family history of throat cancer     Gout     Hypercholesteremia     Hypercholesteremia     Hypothyroidism           Past Surgical History:  Procedure Laterality Date   ANTERIOR CERVICAL DECOMPRESSION/DISCECTOMY FUSION 4 LEVELS N/A 05/06/2021    Procedure: CERVICAL THREE-FOUR, CERVICAL FOUR-FIVE, CERVICAL FIVE-SIX, CERVICAL SIX-SEVEN ANTERIOR CERVICAL DECOMPRESSION/DISCECTOMY FUSION;  Surgeon: Donalee Citrin, MD;  Location: [facility] OR;  Service: Neurosurgery;  Laterality: N/A;   BACK SURGERY   2002    lumbar disc, two surgeries   COLONOSCOPY N/A 12/08/2015    Procedure: COLONOSCOPY;  Surgeon: West Bali, MD;  Location: [facility];  Service: Endoscopy;  Laterality: N/A;  9:15 AM   COLONOSCOPY WITH PROPOFOL N/A 11/18/2021    Procedure: COLONOSCOPY WITH PROPOFOL;  Surgeon: Lanelle Bal, DO;  Location: [facility];   Service: Endoscopy;  Laterality: N/A;  10:15 am   POLYPECTOMY   11/18/2021    Procedure: POLYPECTOMY;  Surgeon: Lanelle Bal, DO;  [facility]: [facility];  Service: Endoscopy;;   TUBAL LIGATION            Patient Active Problem List    Diagnosis Date Noted   Pain of left shoulder region 05/26/2022   History of adenomatous polyp of colon 09/29/2021   Spinal stenosis of cervical region 05/06/2021   Family history of pancreatic cancer     Family history of ovarian cancer     Family history of leukemia     Family history of lung cancer     Family history of throat cancer     Family history of brain cancer     Family history of stomach cancer     Family history of colon cancer     Family history of kidney cancer     Family history of prostate cancer     Spinal stenosis at L4-L5 level 07/21/2018   Constipation 11/17/2015   Special screening for malignant neoplasms, colon 11/17/2015      PCP: Elfredia Nevins, MD    REFERRING PROVIDER: Madelyn Brunner, DO    REFERRING DIAG: [phone] (ICD-10-CM) - Chronic left shoulder pain    THERAPY  DIAG:  No diagnosis found.   Rationale for Evaluation and Treatment: Rehabilitation   ONSET DATE: Chronic shoulder pain with a fall 4-5 months ago.   SUBJECTIVE:                                                                                                                                                                                       SUBJECTIVE STATEMENT: Deaun reports home exercise program compliance has suffered since she has been ill over the past week.  Better AROM and strength are noted.  Pain can still be high.   PERTINENT HISTORY: ACDF from C3-C7 on 05/06/2021, moderate to severe OA in Lt shoulder and RTC tendinosis, back surgery 2002   PAIN:  Are you having pain? Yes: NPRS scale: As high as 3-7/10 (was 9/10) on a 10/10 Pain location: Left shoulder Pain description: Just hurts, constant, can get sharp jabs Aggravating  factors: Left shoulder use, particularly reaching Relieving factors: Sleep   PRECAUTIONS: None   WEIGHT BEARING RESTRICTIONS: No   FALLS:  Has patient fallen in last 6 months? Yes. Number of falls 1   LIVING ENVIRONMENT: Lives with: lives with their family and lives with their spouse Lives in: House/apartment Stairs: OK with stairs Has following equipment at home:  Has a walker but does not need it.  Shower bars and chair and doesn't need them.   OCCUPATION: Retired   PLOF: Independent   PATIENT GOALS:Maintain house and works with her church   NEXT MD VISIT: 07/21/2022   OBJECTIVE:    DIAGNOSTIC FINDINGS:  "EMG with Dr. Alvester Morin on 06/02/22, essentially showed mild median nerve entrapment at the wrist indicative of carpal tunnel syndrome, otherwise was within normal limits."  Moderate to severe left shoulder OA.   MRI left shoulder.   IMPRESSION: 1. Mild tendinosis of the supraspinatus, infraspinatus and subscapularis tendons. 2. Mild tendinosis of the intra-articular portion of the long head of the biceps tendon. 3. Moderate-severe osteoarthritis of the glenohumeral joint   PATIENT SURVEYS:  07/08/2022 FOTO 34  06/10/22: FOTO 51 (Goal 67 in 12 visits)   COGNITION: Overall cognitive status: Within functional limits for tasks assessed                                  SENSATION: No complaints of new peripheral pain or paresthesias   POSTURE: Significant for forward head, internally rotated and protracted shoulders   UPPER EXTREMITY ROM:    Passive ROM Left/Right 06/10/2022 Left 06/29/2022  Shoulder flexion 135/160   150  Shoulder extension      Shoulder abduction  Shoulder horizontal adduction 35 /30  35  Shoulder internal rotation 35/40  50   Shoulder external rotation 90/100  90   Elbow flexion      Elbow extension      Wrist flexion      Wrist extension      Wrist ulnar deviation      Wrist radial deviation      Wrist pronation      Wrist supination       (Blank rows = not tested)   UPPER EXTREMITY MMT:   MMT Left/Right 06/10/2022 Left 06/29/2022  Shoulder flexion      Shoulder extension      Shoulder abduction      Shoulder adduction      Shoulder internal rotation 9.3 /33.0  23.6  Shoulder external rotation 7.6/20.5   17.5  Middle trapezius      Lower trapezius      Elbow flexion      Elbow extension      Wrist flexion      Wrist extension      Wrist ulnar deviation      Wrist radial deviation      Wrist pronation      Wrist supination      Grip strength (lbs)      (Blank rows = not tested)   TODAY'S TREATMENT:  07/08/2022: Supine arm raises 2 sets of 20 x 3 seconds with 3# Shoulder blade pinches 10 x 5 seconds Shoulder ER Thera-Band Blue 3 sets of 10 times each direction Shoulder IR Thera-Band Blue 2 sets of 10 times each direction Supine IR stretch 10X 10 seconds with PT assist Supine shoulder flexion (protract 1st, palm facing in) 10X 5 seconds  Neuromuscular re-education: Body blade: IR/ER; Up/down with arm by side; protract/retract in elbow 90 degrees position 2 x 20 seconds each position Review of benefits of PT vs benefits of TSA.  Recommended she follow-up with Dr. Magnus Ivan to see if she is a TSA candidate.   06/29/2022: Supine arm raises 2 sets of 20 x 3 seconds with 3# Shoulder blade pinches 3 sets of 5 x 5 seconds Shoulder ER Thera-Band Blue 3 sets of 10 times each direction Shoulder IR Thera-Band Blue 2 sets of 10 times each direction Supine IR stretch 10X 10 seconds with PT assist Supine shoulder flexion (protract 1st, palm facing in) 10X 5 seconds  Review of new objective findings and of the importance of consistent HEP compliance   06/25/2022: Supine arm raises 2 sets of 20 x 3 seconds with 3# Shoulder blade pinches 3 sets of 5 x 5 seconds Shouler ER/IR Thera-Band green 4 sets of 10 times each direction Supine IR stretch 10X 10 seconds with PT assist Supine shoulder flexion (protract 1st, palm facing  in) 10X 5 seconds  Review of exam findings and of the importance of consistent HEP compliance   PATIENT EDUCATION: Education details: HEP, PT plan of care Person educated: Patient Education method: Explanation, Demonstration, Verbal cues, and Handouts Education comprehension: verbalized understanding and needs further education     HOME EXERCISE PROGRAM: [id number]   ASSESSMENT:   CLINICAL IMPRESSION:  Marlayna has made significant early progress with her AROM and strength as assessed objectively.  With consistent home exercise program compliance, she should continue to make gains in AROM and strength.  Home exercise program compliance has been inconsistent and will need to improve.  Chemeka is also aware she has severe arthritis and may be a TSA candidate.  She is leaning towards scheduling a shoulder replacement with Dr. Magnus Ivan, but will discuss this with him when she sees him in about 2 weeks.  In the meantime, we will continue our AROM and strength work with recommendations made to improve home exercise program compliance.  If Dr. Magnus Ivan believes Raigen is a total shoulder replacement candidate, we will transfer Randa Evens into independent rehabilitation up until her surgery.  If he does not believe she is appropriate for TSA, we will continue to work on goals established at evaluation.   OBJECTIVE IMPAIRMENTS: decreased activity tolerance, decreased shoulder mobility, decreased ROM, decreased strength, impaired flexibility, impaired UE use, postural dysfunction, and pain.   ACTIVITY LIMITATIONS: reaching, lifting, carry,  cleaning, driving, and or occupation   PERSONAL FACTORS:  ACDF from C3-C7 on 05/06/2021, moderate to severe OA in Lt shoulder and RTC tendinosis, back surgery 2002 are also affecting patient's functional outcome.   REHAB POTENTIAL: Good   CLINICAL DECISION MAKING: Stable/uncomplicated   EVALUATION COMPLEXITY: Low       GOALS: Short term PT Goals Target date:  07/08/2022 Pt will be I and compliant with her day 1 HEP. Baseline: Started 06/10/2022 Goal status: Not met 07/08/2022  Improve left shoulder AROM for flexion, IR, ER and horizontal adduction  Baseline: 135, 35, 90 and 35 degrees Goal status: Met 06/29/2022   Long term PT goals Target date: 08/06/2022 Pt will improve Lt shoulder AROM to 90% or better as compared to the uninvolved right to improve functional reaching Baseline: See objective Goal status: On Going 07/08/2022  Pt will improve  Lt shoulder strength for ER to at least 15 pounds and IR to at least 20 pounds  Baseline: 7.6 and 9.3 pounds respectively Goal status: Met 06/29/2022  Pt will improve FOTO to at least 67% functional to show improved function Baseline: 51 Goal status: On Going 07/08/2022  Pt will reduce pain to consistently less than 4/10 with usual activity and work activity. Baseline: 5 - 9 out of 10 Goal status: On Going 07/08/2022  5.  Sania will be independent with her long-term home exercise program at discharge  Baseline: Started 06/10/2022  Goal status: On Going 07/08/2022   PLAN: PT FREQUENCY: 1-2 times per week    PT DURATION: 4 weeks   PLANNED INTERVENTIONS (unless contraindicated): aquatic PT, Canalith repositioning, cryotherapy, Electrical stimulation, Iontophoresis with 4 mg/ml dexamethasome, Moist heat, traction, Ultrasound, gait training, Therapeutic exercise, balance training, neuromuscular re-education, patient/family education, prosthetic training, manual techniques, passive ROM, dry needling, taping, vasopnuematic device, vestibular, spinal manipulations, joint manipulations   PLAN FOR NEXT SESSION: Emphasis on scapular and rotator cuff strengthening with appropriate active range of motion activities while avoiding impingement.  Cherlyn Cushing, PT, MPT 07/08/22 1:20 PM   PHYSICAL THERAPY DISCHARGE SUMMARY  Visits from Start of Care: 5  Current functional level related to goals / functional  outcomes: See note   Remaining deficits: See note   Education / Equipment: HEP  Patient goals were partially met. Patient is being discharged due to not returning since the last visit.  Chyrel Masson, PT, DPT, OCS, ATC 08/17/22  2:20 PM

## 2022-07-15 ENCOUNTER — Encounter: Payer: Medicare Other | Admitting: Rehabilitative and Restorative Service Providers"

## 2022-07-15 ENCOUNTER — Telehealth: Payer: Self-pay | Admitting: Rehabilitative and Restorative Service Providers"

## 2022-07-15 NOTE — Telephone Encounter (Signed)
Called to check on Gerardo secondary to her no show.  Her VM was full.  I'll send her a email to remind her of her next appointment.

## 2022-07-21 ENCOUNTER — Ambulatory Visit (INDEPENDENT_AMBULATORY_CARE_PROVIDER_SITE_OTHER): Payer: Medicare Other | Admitting: Orthopaedic Surgery

## 2022-07-21 ENCOUNTER — Encounter: Payer: Self-pay | Admitting: Orthopaedic Surgery

## 2022-07-21 ENCOUNTER — Other Ambulatory Visit: Payer: Self-pay

## 2022-07-21 DIAGNOSIS — G8929 Other chronic pain: Secondary | ICD-10-CM

## 2022-07-21 DIAGNOSIS — M19012 Primary osteoarthritis, left shoulder: Secondary | ICD-10-CM

## 2022-07-21 DIAGNOSIS — M25512 Pain in left shoulder: Secondary | ICD-10-CM

## 2022-07-21 NOTE — Progress Notes (Signed)
The patient returns for follow-up after having an intra-articular steroid injection in her left shoulder under ultrasound by Dr. Shon Baton and having gone through physical therapy for her left shoulder.  She does report improved mobility with the left shoulder but continued pain.  She had had an MRI earlier this year of her left shoulder that was ordered by Dr. Wynetta Emery and that showed just mild tendinitis of the shoulder but it was moderate to significant arthritis of the glenohumeral joint.  On my exam today her mobility is improving with her left shoulder but she still has significant deep shoulder pain on that side.  The rotator cuff itself is intact.  I am going to send her for a CT scan without contrast with thin cuts of the left shoulder and then have her referred to my partner Dr. August Saucer to get his opinion of whether or not she would benefit or qualify for a shoulder arthroplasty if it is warranted.

## 2022-07-22 ENCOUNTER — Encounter: Payer: Medicare Other | Admitting: Rehabilitative and Restorative Service Providers"

## 2022-08-04 ENCOUNTER — Ambulatory Visit (INDEPENDENT_AMBULATORY_CARE_PROVIDER_SITE_OTHER): Payer: Medicare Other | Admitting: Orthopedic Surgery

## 2022-08-04 DIAGNOSIS — M19012 Primary osteoarthritis, left shoulder: Secondary | ICD-10-CM

## 2022-08-05 ENCOUNTER — Encounter: Payer: Self-pay | Admitting: Orthopedic Surgery

## 2022-08-05 NOTE — Progress Notes (Signed)
Office Visit Note   Patient: Dana Holder           Date of Birth: 02/13/1955           MRN: [id number] Visit Date: 08/04/2022 Requested by: Elfredia Nevins, MD [address] [place],  [place] [address] PCP: Elfredia Nevins, MD  Subjective: Chief Complaint  Patient presents with   Left Shoulder - Pain    HPI: Dana Holder is a 67 y.o. female who presents to the office reporting left shoulder pain.  Patient states the pain is about the same as it has always been.  Patient is diabetic with high blood pressure and is a smoker.  Reports constant pain in the left shoulder more or less all the time.  She has had 2 shoulder injections.  Last intra-articular injection gave her about 50% relief for short amount of time.  Hurts for her to move that arm up overhead.  Occasionally has pain radiating below the elbow.  Does have a history of multilevel cervical spine fusion beginning at C3 and extending down to C6-7.  She reports having a fall about 2 months ago which really initiated her shoulder pain.  MRI scan of the shoulder demonstrates moderate to severe arthritis of the glenohumeral joint with intact rotator cuff and mild tendinosis of the long head of the biceps tendon..                ROS: All systems reviewed are negative as they relate to the chief complaint within the history of present illness.  Patient denies fevers or chills.  Assessment & Plan: Visit Diagnoses:  1. Primary osteoarthritis, left shoulder     Plan: Impression is left shoulder arthritis refractory to nonoperative management.  She does have some symptoms which are consistent with radiculopathy but most of her pain appears to be coming from her shoulder based on exam.  She is interested in shoulder replacement.  Really could not do that until January.  Plan at this time is to obtain a thin cut CT scan of the left shoulder in October with plans for shoulder replacement in January.  Will see her back after that CT  scan for further evaluation and management and reevaluation of her symptoms to make sure they are remaining primarily within the shoulder girdle and not extending down the arm.  Follow-Up Instructions: No follow-ups on file.   Orders:  No orders of the defined types were placed in this encounter.  No orders of the defined types were placed in this encounter.     Procedures: No procedures performed   Clinical Data: No additional findings.  Objective: Vital Signs: There were no vitals taken for this visit.  Physical Exam:  Constitutional: Patient appears well-developed HEENT:  Head: Normocephalic Eyes:EOM are normal Neck: Normal range of motion Cardiovascular: Normal rate Pulmonary/chest: Effort normal Neurologic: Patient is alert Skin: Skin is warm Psychiatric: Patient has normal mood and affect  Ortho Exam: Ortho exam demonstrates painful range of motion above 90 degrees of forward flexion and abduction.  She does have good deltoid strength.  Good rotator cuff strength infraspinatus supraspinatus and subscap muscle testing.  Some tenderness around the biceps tendon.  No discrete AC joint tenderness.  Neck range of motion predictably diminished based on levels of fusion.  No definite paresthesias C5-T1.  No muscle atrophy in the arm.  Radial pulse intact.  No other masses lymphadenopathy or skin changes noted in that shoulder girdle region.  Specialty Comments:  MRI left shoulder.    IMPRESSION: 1. Mild tendinosis of the supraspinatus, infraspinatus and subscapularis tendons. 2. Mild tendinosis of the intra-articular portion of the long head of the biceps tendon. 3. Moderate-severe osteoarthritis of the glenohumeral joint.     Electronically Signed   By: Elige Ko M.D.   On: 03/13/2022 06:43  Imaging: No results found.   PMFS History: Patient Active Problem List   Diagnosis Date Noted   Pain of left shoulder region 05/26/2022   History of adenomatous polyp  of colon 09/29/2021   Spinal stenosis of cervical region 05/06/2021   Family history of pancreatic cancer    Family history of ovarian cancer    Family history of leukemia    Family history of lung cancer    Family history of throat cancer    Family history of brain cancer    Family history of stomach cancer    Family history of colon cancer    Family history of kidney cancer    Family history of prostate cancer    Spinal stenosis at L4-L5 level 07/21/2018   Constipation 11/17/2015   Special screening for malignant neoplasms, colon 11/17/2015   Past Medical History:  Diagnosis Date   Family history of brain cancer    Family history of colon cancer    Family history of kidney cancer    Family history of leukemia    Family history of lung cancer    Family history of ovarian cancer    Family history of pancreatic cancer    Family history of prostate cancer    Family history of stomach cancer    Family history of throat cancer    Gout    Hypercholesteremia    Hypercholesteremia    Hypothyroidism     Family History  Problem Relation Age of Onset   Acute myelogenous leukemia Son    Throat cancer Father 32   Ovarian cancer Sister 71   Lung cancer Brother 5       non-small cell   Brain cancer Maternal Aunt        dx. in her 72s   Lung cancer Maternal Uncle        dx. in his 30s or 68s, smoker   Heart attack Maternal Grandmother    Pancreatic cancer Brother 54   Stomach cancer Maternal Aunt        dx. in her 62s   Colon cancer Cousin        dx. >50 - maternal cousin   Kidney cancer Cousin        dx. in his 23s - maternal cousin   Prostate cancer Cousin 16       maternal cousin   Anesthesia problems Neg Hx    Hypotension Neg Hx    Malignant hyperthermia Neg Hx    Pseudochol deficiency Neg Hx     Past Surgical History:  Procedure Laterality Date   ANTERIOR CERVICAL DECOMPRESSION/DISCECTOMY FUSION 4 LEVELS N/A 05/06/2021   Procedure: CERVICAL THREE-FOUR, CERVICAL  FOUR-FIVE, CERVICAL FIVE-SIX, CERVICAL SIX-SEVEN ANTERIOR CERVICAL DECOMPRESSION/DISCECTOMY FUSION;  Surgeon: Donalee Citrin, MD;  Location: [facility] OR;  Service: Neurosurgery;  Laterality: N/A;   BACK SURGERY  2002   lumbar disc, two surgeries   COLONOSCOPY N/A 12/08/2015   Procedure: COLONOSCOPY;  Surgeon: West Bali, MD;  [facility];  Service: Endoscopy;  Laterality: N/A;  9:15 AM   COLONOSCOPY WITH PROPOFOL N/A 11/18/2021   Procedure: COLONOSCOPY WITH PROPOFOL;  Surgeon: Marletta Lor,  Hennie Duos, DO;  Location: [facility];  Service: Endoscopy;  Laterality: N/A;  10:15 am   POLYPECTOMY  11/18/2021   Procedure: POLYPECTOMY;  Surgeon: Lanelle Bal, DO;  Location: [facility];  Service: Endoscopy;;   TUBAL LIGATION     Social History   Occupational History   Not on file  Tobacco Use   Smoking status: Former    Packs/day: 0.50    Years: 30.00    Additional pack years: 0.00    Total pack years: 15.00    Types: Cigarettes    Quit date: 06/21/2002    Years since quitting: 20.1   Smokeless tobacco: Never  Vaping Use   Vaping Use: Never used  Substance and Sexual Activity   Alcohol use: No   Drug use: No   Sexual activity: Yes    Birth control/protection: Post-menopausal

## 2022-08-09 NOTE — Addendum Note (Signed)
Addended byPrescott Parma on: 08/09/2022 08:28 AM   Modules accepted: Orders

## 2022-08-23 ENCOUNTER — Other Ambulatory Visit (HOSPITAL_COMMUNITY): Payer: Self-pay | Admitting: Internal Medicine

## 2022-08-23 DIAGNOSIS — Z1231 Encounter for screening mammogram for malignant neoplasm of breast: Secondary | ICD-10-CM

## 2022-09-27 ENCOUNTER — Ambulatory Visit (HOSPITAL_COMMUNITY): Payer: Medicare Other

## 2022-10-07 ENCOUNTER — Ambulatory Visit (HOSPITAL_COMMUNITY)
Admission: RE | Admit: 2022-10-07 | Discharge: 2022-10-07 | Disposition: A | Discharge status: Home / Self Care | Payer: Medicare Other | Source: Ambulatory Visit | Attending: Internal Medicine | Admitting: Internal Medicine

## 2022-10-07 DIAGNOSIS — Z1231 Encounter for screening mammogram for malignant neoplasm of breast: Secondary | ICD-10-CM | POA: Insufficient documentation

## 2022-11-04 ENCOUNTER — Ambulatory Visit (INDEPENDENT_AMBULATORY_CARE_PROVIDER_SITE_OTHER): Payer: Medicare Other | Admitting: Surgical

## 2022-11-04 DIAGNOSIS — M19012 Primary osteoarthritis, left shoulder: Secondary | ICD-10-CM | POA: Diagnosis not present

## 2022-11-07 ENCOUNTER — Encounter: Payer: Self-pay | Admitting: Surgical

## 2022-11-07 NOTE — Progress Notes (Signed)
Office Visit Note   Patient: Dana Holder           Date of Birth: 08-Jan-1956           MRN: [id number] Visit Date: 11/04/2022 Requested by: Elfredia Nevins, MD [address] [place],  [place] [address] PCP: Elfredia Nevins, MD  Subjective: Chief Complaint  Patient presents with   Left Shoulder - Follow-up, Pain    HPI: Dana Holder is a 67 y.o. female who presents to the office reporting left shoulder pain.  Patient returns following her last visit with Dr. August Saucer.  She continues to localize the majority of her pain in the left arm to the shoulder and elbow though she does have some occasional radiation of pain to the wrist.  She denies any numbness or tingling or consistent burning sensation.  She mostly describes a throbbing sensation and pain in the lateral aspect of the shoulder.  She is able to sleep on her right shoulder but not her left.  Most of her daily life involves watching TV and helping out at church though she does have some fairly large dogs at home that will occasionally pull on her when she left them out into the yard..                ROS: All systems reviewed are negative as they relate to the chief complaint within the history of present illness.  Patient denies fevers or chills.  Assessment & Plan: Visit Diagnoses:  1. Primary osteoarthritis, left shoulder     Plan: Patient is a 67 year old female who presents for evaluation of left shoulder pain.  She has history of left shoulder osteoarthritis.  Has had prior injection giving her about 50% relief.  Does have some radiation of pain to her wrist which may be some residual symptoms coming from her cervical spine with her history of C-spine fusion.  However, with the vast majority of her symptoms around her shoulder and radiating to her elbow most of the time, she would like to proceed with shoulder replacement.  We discussed the concept of anatomic and reverse shoulder replacement.  She does have good rotator  cuff strength on exam today.  Decent external rotation at about 40 degrees.  Discussed the risks and benefits of shoulder replacement as well as the recovery timeframe.  All questions answered.  We will order thin cut CT scan of the left shoulder and patient will follow-up to review the CT scan with Dr. August Saucer and so that he may answer any lingering questions she may think about.  Follow-Up Instructions: No follow-ups on file.   Orders:  Orders Placed This Encounter  Procedures   CT SHOULDER LEFT WO CONTRAST   No orders of the defined types were placed in this encounter.     Procedures: No procedures performed   Clinical Data: No additional findings.  Objective: Vital Signs: There were no vitals taken for this visit.  Physical Exam:  Constitutional: Patient appears well-developed HEENT:  Head: Normocephalic Eyes:EOM are normal Neck: Normal range of motion Cardiovascular: Normal rate Pulmonary/chest: Effort normal Neurologic: Patient is alert Skin: Skin is warm Psychiatric: Patient has normal mood and affect  Ortho Exam: Ortho exam demonstrates left shoulder with 40 degrees X rotation, 100 degrees abduction, 145 degrees forward elevation passively.  She has axillary nerve intact with deltoid firing.  Good rotator cuff strength of supraspinatus, infraspinatus, subscapularis rated 5/5.  Increased pain with passive motion of the shoulder.  No cellulitis  or skin changes noted around the shoulder region.  No tenderness over the [facility] joint but she does have moderate tenderness over the bicipital groove.  2+ radial pulse of the left upper extremity.  Specialty Comments:  MRI left shoulder.    IMPRESSION: 1. Mild tendinosis of the supraspinatus, infraspinatus and subscapularis tendons. 2. Mild tendinosis of the intra-articular portion of the long head of the biceps tendon. 3. Moderate-severe osteoarthritis of the glenohumeral joint.     Electronically Signed   By: Elige Ko  M.D.   On: 03/13/2022 06:43  Imaging: No results found.   PMFS History: Patient Active Problem List   Diagnosis Date Noted   Pain of left shoulder region 05/26/2022   History of adenomatous polyp of colon 09/29/2021   Spinal stenosis of cervical region 05/06/2021   Family history of pancreatic cancer    Family history of ovarian cancer    Family history of leukemia    Family history of lung cancer    Family history of throat cancer    Family history of brain cancer    Family history of stomach cancer    Family history of colon cancer    Family history of kidney cancer    Family history of prostate cancer    Spinal stenosis at L4-L5 level 07/21/2018   Constipation 11/17/2015   Special screening for malignant neoplasms, colon 11/17/2015   Past Medical History:  Diagnosis Date   Family history of brain cancer    Family history of colon cancer    Family history of kidney cancer    Family history of leukemia    Family history of lung cancer    Family history of ovarian cancer    Family history of pancreatic cancer    Family history of prostate cancer    Family history of stomach cancer    Family history of throat cancer    Gout    Hypercholesteremia    Hypercholesteremia    Hypothyroidism     Family History  Problem Relation Age of Onset   Acute myelogenous leukemia Son    Throat cancer Father 85   Ovarian cancer Sister 26   Lung cancer Brother 61       non-small cell   Brain cancer Maternal Aunt        dx. in her 9s   Lung cancer Maternal Uncle        dx. in his 43s or 52s, smoker   Heart attack Maternal Grandmother    Pancreatic cancer Brother 17   Stomach cancer Maternal Aunt        dx. in her 55s   Colon cancer Cousin        dx. >50 - maternal cousin   Kidney cancer Cousin        dx. in his 92s - maternal cousin   Prostate cancer Cousin 23       maternal cousin   Anesthesia problems Neg Hx    Hypotension Neg Hx    Malignant hyperthermia Neg Hx     Pseudochol deficiency Neg Hx     Past Surgical History:  Procedure Laterality Date   ANTERIOR CERVICAL DECOMPRESSION/DISCECTOMY FUSION 4 LEVELS N/A 05/06/2021   Procedure: CERVICAL THREE-FOUR, CERVICAL FOUR-FIVE, CERVICAL FIVE-SIX, CERVICAL SIX-SEVEN ANTERIOR CERVICAL DECOMPRESSION/DISCECTOMY FUSION;  Surgeon: Donalee Citrin, MD;  Location: [facility] OR;  Service: Neurosurgery;  Laterality: N/A;   BACK SURGERY  2002   lumbar disc, two surgeries   COLONOSCOPY N/A 12/08/2015  Procedure: COLONOSCOPY;  Surgeon: West Bali, MD;  [facility];  Service: Endoscopy;  Laterality: N/A;  9:15 AM   COLONOSCOPY WITH PROPOFOL N/A 11/18/2021   Procedure: COLONOSCOPY WITH PROPOFOL;  Surgeon: Lanelle Bal, DO;  [facility];  Service: Endoscopy;  Laterality: N/A;  10:15 am   POLYPECTOMY  11/18/2021   Procedure: POLYPECTOMY;  Surgeon: Lanelle Bal, DO;  [facility];  Service: Endoscopy;;   TUBAL LIGATION     Social History   Occupational History   Not on file  Tobacco Use   Smoking status: Former    Current packs/day: 0.00    Average packs/day: 0.5 packs/day for 30.0 years (15.0 ttl pk-yrs)    Types: Cigarettes    Start date: 06/20/1972    Quit date: 06/21/2002    Years since quitting: 20.3   Smokeless tobacco: Never  Vaping Use   Vaping status: Never Used  Substance and Sexual Activity   Alcohol use: No   Drug use: No   Sexual activity: Yes    Birth control/protection: Post-menopausal

## 2022-11-18 ENCOUNTER — Ambulatory Visit (HOSPITAL_COMMUNITY): Payer: Medicare Other

## 2022-12-03 ENCOUNTER — Ambulatory Visit (HOSPITAL_COMMUNITY)
Admission: RE | Admit: 2022-12-03 | Discharge: 2022-12-03 | Disposition: A | Discharge status: Home / Self Care | Payer: Medicare Other | Source: Ambulatory Visit | Attending: Orthopedic Surgery | Admitting: Orthopedic Surgery

## 2022-12-03 DIAGNOSIS — M19012 Primary osteoarthritis, left shoulder: Secondary | ICD-10-CM | POA: Diagnosis present

## 2022-12-16 ENCOUNTER — Telehealth: Payer: Self-pay

## 2022-12-16 NOTE — Telephone Encounter (Signed)
Tried calling patient to see if she could come in at some point Wednesday afternoon next week to see Dr August Saucer. No answer. Will try to reach patient again tomorrow.

## 2022-12-16 NOTE — Telephone Encounter (Signed)
-----   Message from [facility] sent at 12/16/2022  3:07 PM EST ----- Can we change her appointment from seeing me tomorrow to seeing Dr August Saucer at some point? She wanted to see him prior to surgery to discuss questions and go over CT scan.  I can trade a patient with him on Monday or Wednesday probably

## 2022-12-17 ENCOUNTER — Ambulatory Visit (INDEPENDENT_AMBULATORY_CARE_PROVIDER_SITE_OTHER): Payer: Medicare Other | Admitting: Surgical

## 2022-12-17 DIAGNOSIS — M19012 Primary osteoarthritis, left shoulder: Secondary | ICD-10-CM | POA: Diagnosis not present

## 2022-12-17 NOTE — Telephone Encounter (Signed)
Tried calling patient again, no answer.

## 2022-12-21 ENCOUNTER — Telehealth: Payer: Self-pay

## 2022-12-21 NOTE — Telephone Encounter (Signed)
Need to f/u with patient.

## 2022-12-21 NOTE — Progress Notes (Signed)
I can see her sometime in the near future if needed.

## 2022-12-21 NOTE — Progress Notes (Signed)
Hi Lauren.  Can you call and see if she wants to get her shoulder replaced.  Thanks

## 2022-12-21 NOTE — Telephone Encounter (Signed)
-----   Message from Burnard Bunting sent at 12/21/2022 12:14 PM EST ----- Hi Dana Holder.  Can you call and see if she wants to get her shoulder replaced.  Thanks

## 2022-12-22 ENCOUNTER — Encounter: Payer: Self-pay | Admitting: Surgical

## 2022-12-22 NOTE — Progress Notes (Signed)
Follow-up Office Visit Note   Patient: Dana Holder           Date of Birth: 10-03-1955           MRN: [id number] Visit Date: 12/17/2022 Requested by: Elfredia Nevins, MD [address] [place],  [place] [address] PCP: Elfredia Nevins, MD  Subjective: Chief Complaint  Patient presents with   Other     Review scan    HPI: Dana Holder is a 67 y.o. female who returns to the office for follow-up visit.    Plan at last visit was: Patient is a 67 year old female who presents for evaluation of left shoulder pain. She has history of left shoulder osteoarthritis. Has had prior injection giving her about 50% relief. Does have some radiation of pain to her wrist which may be some residual symptoms coming from her cervical spine with her history of C-spine fusion. However, with the vast majority of her symptoms around her shoulder and radiating to her elbow most of the time, she would like to proceed with shoulder replacement. We discussed the concept of anatomic and reverse shoulder replacement. She does have good rotator cuff strength on exam today. Decent external rotation at about 40 degrees. Discussed the risks and benefits of shoulder replacement as well as the recovery timeframe. All questions answered. We will order thin cut CT scan of the left shoulder and patient will follow-up to review the CT scan with Dr. August Saucer and so that he may answer any lingering questions she may think about.   Since then, patient notes continued left shoulder pain with no significant change.  She is here to review CT scan and discuss surgery further              ROS: All systems reviewed are negative as they relate to the chief complaint within the history of present illness.  Patient denies fevers or chills.  Assessment & Plan: Visit Diagnoses:  1. Primary osteoarthritis, left shoulder     Plan: Dana Holder is a 67 y.o. female who returns to the office for follow-up visit.  Plan from last  visit was noted above in HPI.  They now return with continued left shoulder pain.  CT scan has been obtained for preoperative planning purposes.  We again had lengthy discussion about the risks and benefits of shoulder replacement and the expected recovery timeframe.  We also discussed what to expect around the time of surgery and the hospital stay.  All questions were answered to her satisfaction and patient would like to proceed with surgery in April 2025.  She will be posted for procedure and follow-up afterward.  Call with any concerns in the meantime.  Follow-Up Instructions: No follow-ups on file.   Orders:  No orders of the defined types were placed in this encounter.  No orders of the defined types were placed in this encounter.     Procedures: No procedures performed   Clinical Data: No additional findings.  Objective: Vital Signs: There were no vitals taken for this visit.  Physical Exam:  Constitutional: Patient appears well-developed HEENT:  Head: Normocephalic Eyes:EOM are normal Neck: Normal range of motion Cardiovascular: Normal rate Pulmonary/chest: Effort normal Neurologic: Patient is alert Skin: Skin is warm Psychiatric: Patient has normal mood and affect  Ortho Exam: Ortho exam demonstrates left shoulder with 45 degrees X rotation, 90 degrees abduction, 155 degrees forward elevation passively and actively.  Patient does have reproduced pain with passive shoulder range of motion.  She has tenderness throughout the bicipital groove.  No tenderness over the [facility] joint or acromion.  Good rotator cuff strength of supra, infra, subscap rated 5/5.  2+ radial pulse of the left upper extremity.  Specialty Comments:  MRI left shoulder.    IMPRESSION: 1. Mild tendinosis of the supraspinatus, infraspinatus and subscapularis tendons. 2. Mild tendinosis of the intra-articular portion of the long head of the biceps tendon. 3. Moderate-severe osteoarthritis of the  glenohumeral joint.     Electronically Signed   By: Elige Ko M.D.   On: 03/13/2022 06:43  Imaging: No results found.   PMFS History: Patient Active Problem List   Diagnosis Date Noted   Pain of left shoulder region 05/26/2022   History of adenomatous polyp of colon 09/29/2021   Spinal stenosis of cervical region 05/06/2021   Family history of pancreatic cancer    Family history of ovarian cancer    Family history of leukemia    Family history of lung cancer    Family history of throat cancer    Family history of brain cancer    Family history of stomach cancer    Family history of colon cancer    Family history of kidney cancer    Family history of prostate cancer    Spinal stenosis at L4-L5 level 07/21/2018   Constipation 11/17/2015   Special screening for malignant neoplasms, colon 11/17/2015   Past Medical History:  Diagnosis Date   Family history of brain cancer    Family history of colon cancer    Family history of kidney cancer    Family history of leukemia    Family history of lung cancer    Family history of ovarian cancer    Family history of pancreatic cancer    Family history of prostate cancer    Family history of stomach cancer    Family history of throat cancer    Gout    Hypercholesteremia    Hypercholesteremia    Hypothyroidism     Family History  Problem Relation Age of Onset   Acute myelogenous leukemia Son    Throat cancer Father 32   Ovarian cancer Sister 22   Lung cancer Brother 25       non-small cell   Brain cancer Maternal Aunt        dx. in her 40s   Lung cancer Maternal Uncle        dx. in his 82s or 17s, smoker   Heart attack Maternal Grandmother    Pancreatic cancer Brother 89   Stomach cancer Maternal Aunt        dx. in her 52s   Colon cancer Cousin        dx. >50 - maternal cousin   Kidney cancer Cousin        dx. in his 42s - maternal cousin   Prostate cancer Cousin 25       maternal cousin   Anesthesia problems  Neg Hx    Hypotension Neg Hx    Malignant hyperthermia Neg Hx    Pseudochol deficiency Neg Hx     Past Surgical History:  Procedure Laterality Date   ANTERIOR CERVICAL DECOMPRESSION/DISCECTOMY FUSION 4 LEVELS N/A 05/06/2021   Procedure: CERVICAL THREE-FOUR, CERVICAL FOUR-FIVE, CERVICAL FIVE-SIX, CERVICAL SIX-SEVEN ANTERIOR CERVICAL DECOMPRESSION/DISCECTOMY FUSION;  Surgeon: Donalee Citrin, MD;  Location: [facility] OR;  Service: Neurosurgery;  Laterality: N/A;   BACK SURGERY  2002   lumbar disc, two surgeries   COLONOSCOPY N/A 12/08/2015  Procedure: COLONOSCOPY;  Surgeon: West Bali, MD;  [facility];  Service: Endoscopy;  Laterality: N/A;  9:15 AM   COLONOSCOPY WITH PROPOFOL N/A 11/18/2021   Procedure: COLONOSCOPY WITH PROPOFOL;  Surgeon: Lanelle Bal, DO;  [facility];  Service: Endoscopy;  Laterality: N/A;  10:15 am   POLYPECTOMY  11/18/2021   Procedure: POLYPECTOMY;  Surgeon: Lanelle Bal, DO;  [facility];  Service: Endoscopy;;   TUBAL LIGATION     Social History   Occupational History   Not on file  Tobacco Use   Smoking status: Former    Current packs/day: 0.00    Average packs/day: 0.5 packs/day for 30.0 years (15.0 ttl pk-yrs)    Types: Cigarettes    Start date: 06/20/1972    Quit date: 06/21/2002    Years since quitting: 20.5   Smokeless tobacco: Never  Vaping Use   Vaping status: Never Used  Substance and Sexual Activity   Alcohol use: No   Drug use: No   Sexual activity: Yes    Birth control/protection: Post-menopausal

## 2022-12-24 NOTE — Telephone Encounter (Signed)
Unable to reach patient. Will try again.

## 2022-12-27 NOTE — Telephone Encounter (Signed)
I called. Mailbox is full and unable to leave message.

## 2023-03-15 ENCOUNTER — Telehealth: Payer: Self-pay | Admitting: Orthopedic Surgery

## 2023-03-15 NOTE — Telephone Encounter (Signed)
Patient left message on voice mail inquiring about the time of her surgery. Returned call but unable to leave message due to voice mail being full.  Patient is scheduled for left reverse shoulder vs anatomic total shoulder arthroplasty with Dr August Saucer on 05-12-23 at 7:15am.  Patient is to arrive at 5:15am at the [facility], Entrance A.

## 2023-03-16 ENCOUNTER — Other Ambulatory Visit (HOSPITAL_COMMUNITY): Payer: Self-pay | Admitting: Internal Medicine

## 2023-03-16 DIAGNOSIS — R1310 Dysphagia, unspecified: Secondary | ICD-10-CM

## 2023-04-03 IMAGING — MG MM DIGITAL SCREENING BILAT W/ TOMO AND CAD
8 of 14 series · 8 of 40 positions shown · non-contrast
Comparison: Previous exam(s).

CLINICAL DATA: Screening.

EXAM:
DIGITAL SCREENING BILATERAL MAMMOGRAM WITH TOMOSYNTHESIS AND CAD
TECHNIQUE: Bilateral screening digital craniocaudal and mediolateral oblique
mammograms were obtained. Bilateral screening digital breast
tomosynthesis was performed. The images were evaluated with
computer-aided detection.

[R MLO synth-2D (1 of 2)]
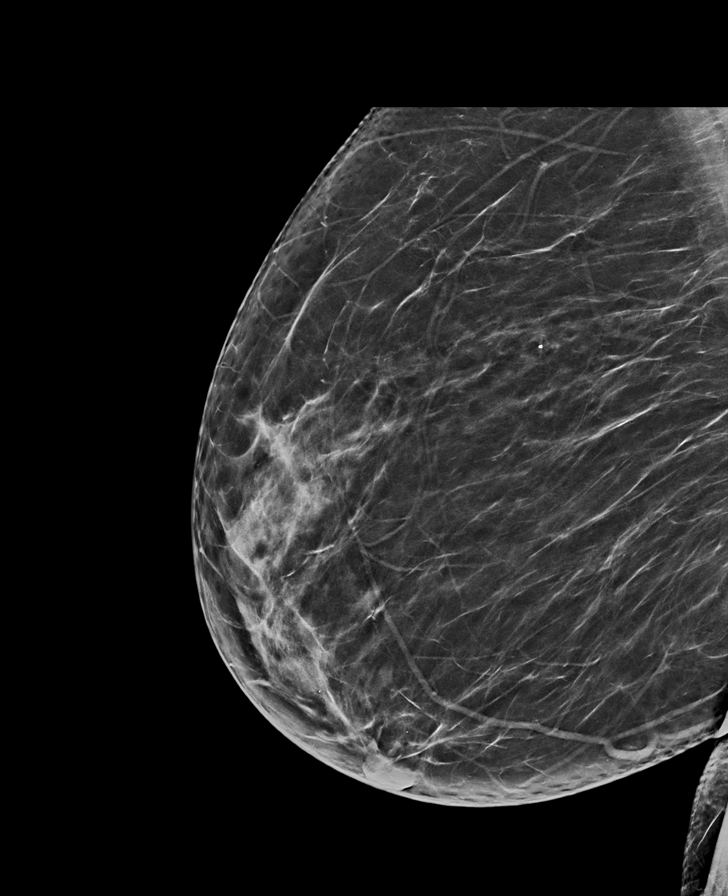

[L MLO synth-2D]
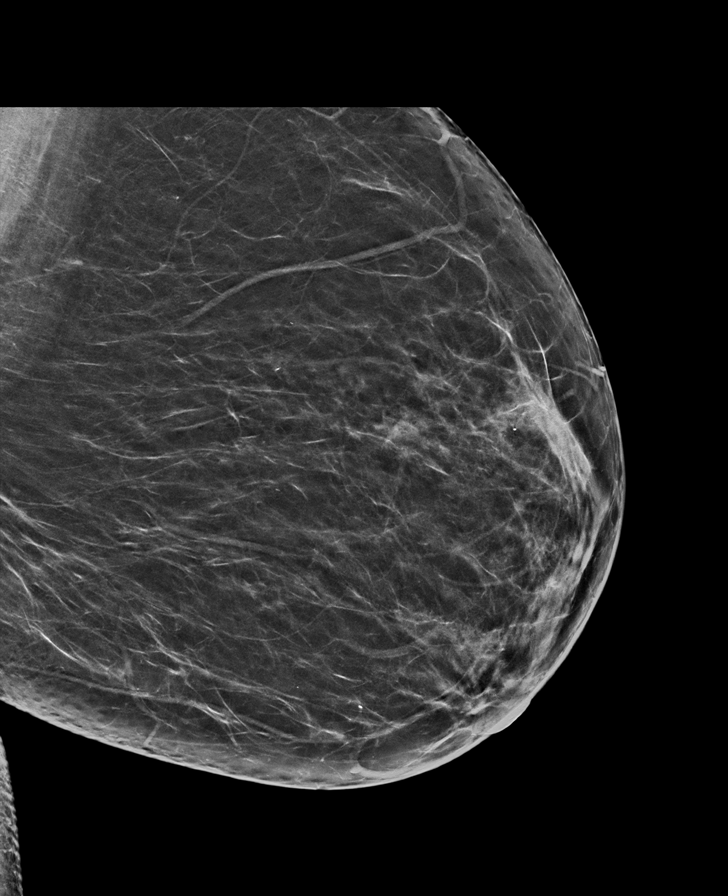

[L CC synth-2D (1 of 2)]
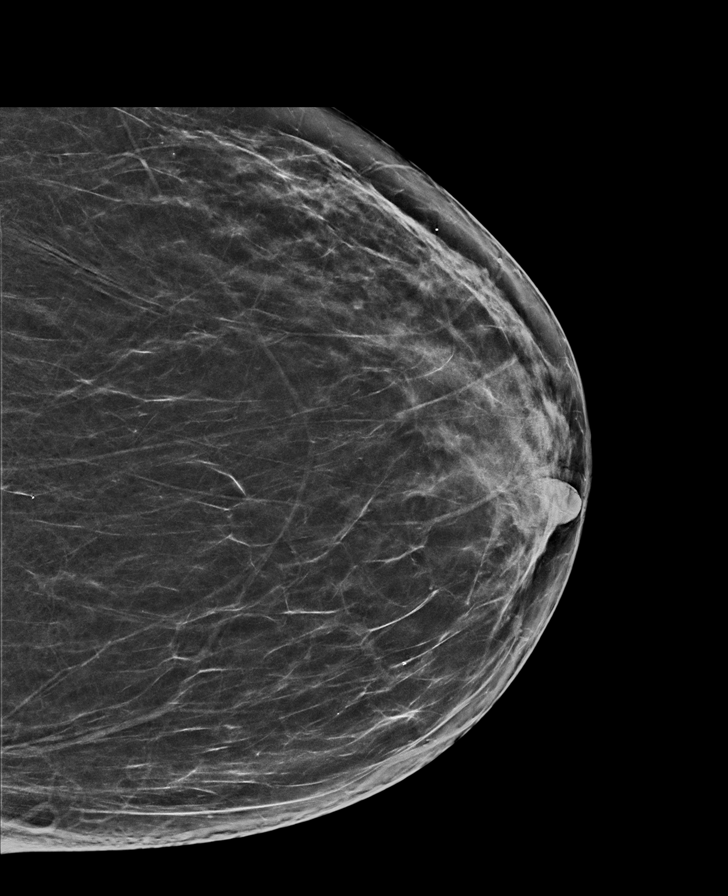

[R CC synth-2D (1 of 2)]
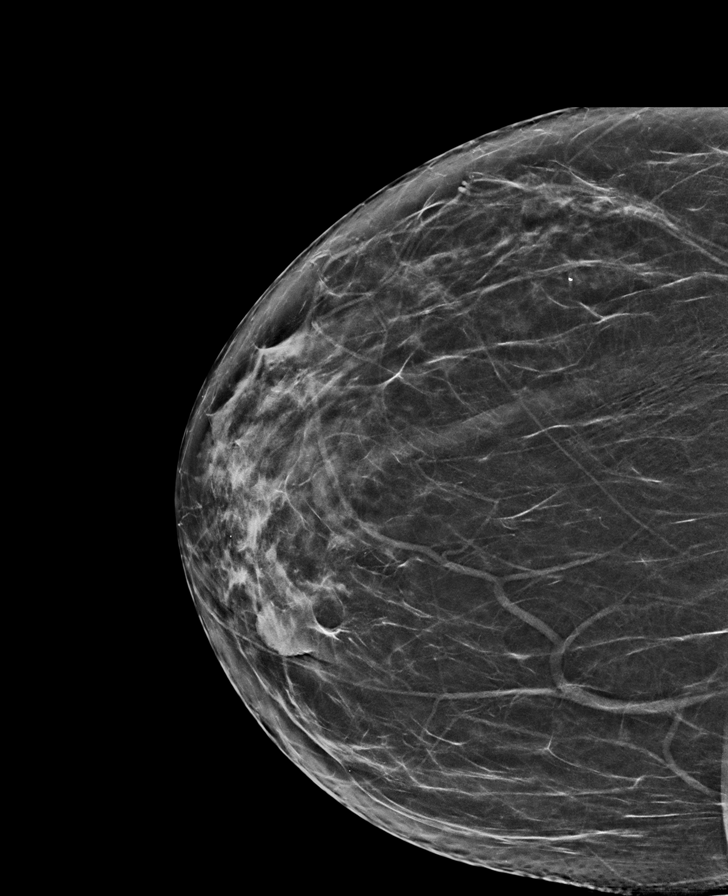

[R MLO synth-2D (2 of 2)]
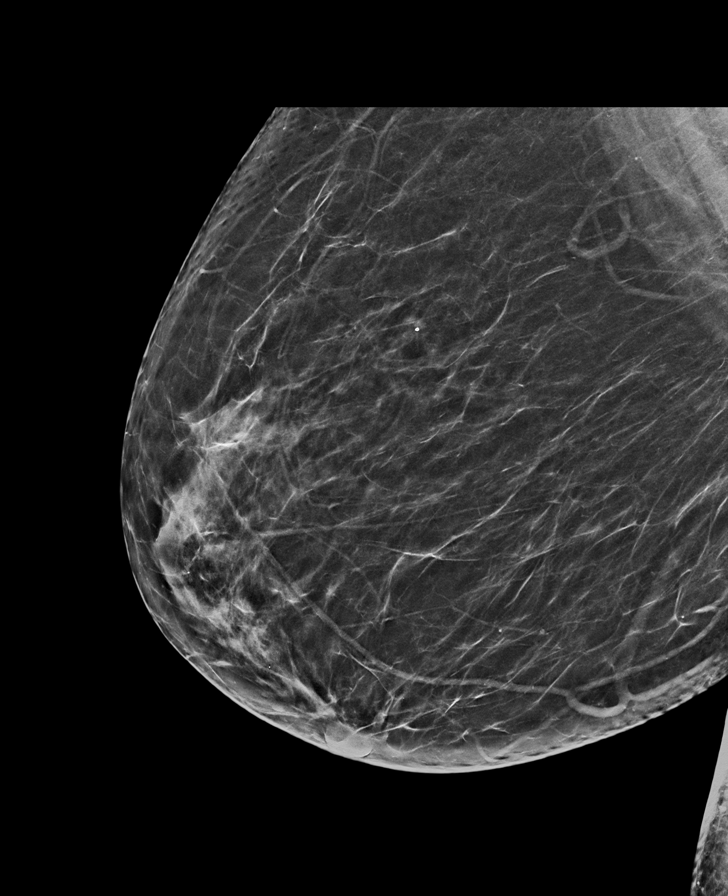

[R CC synth-2D (2 of 2)]
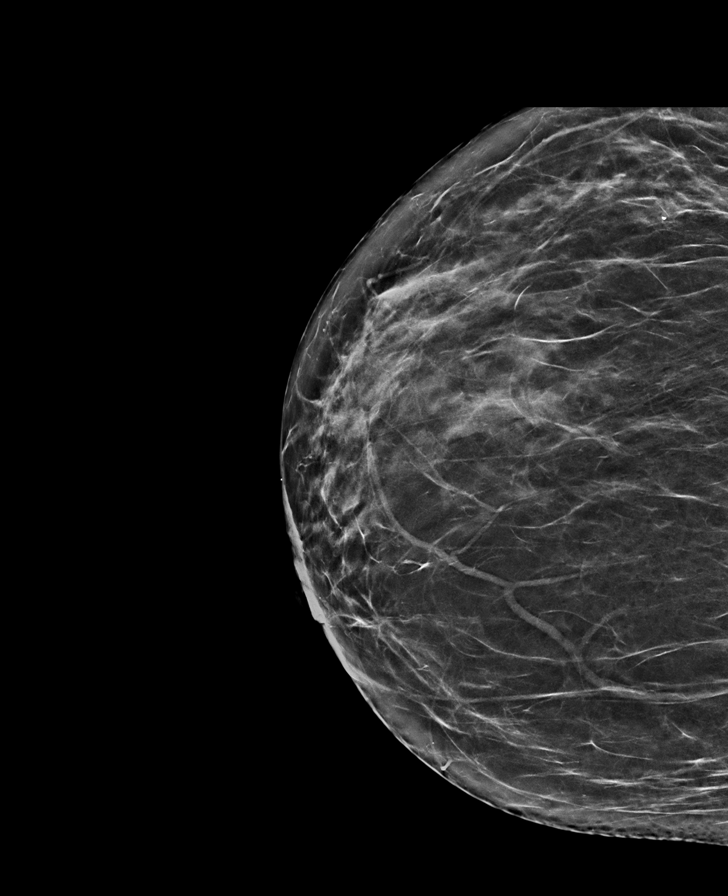

[L CC synth-2D (2 of 2)]
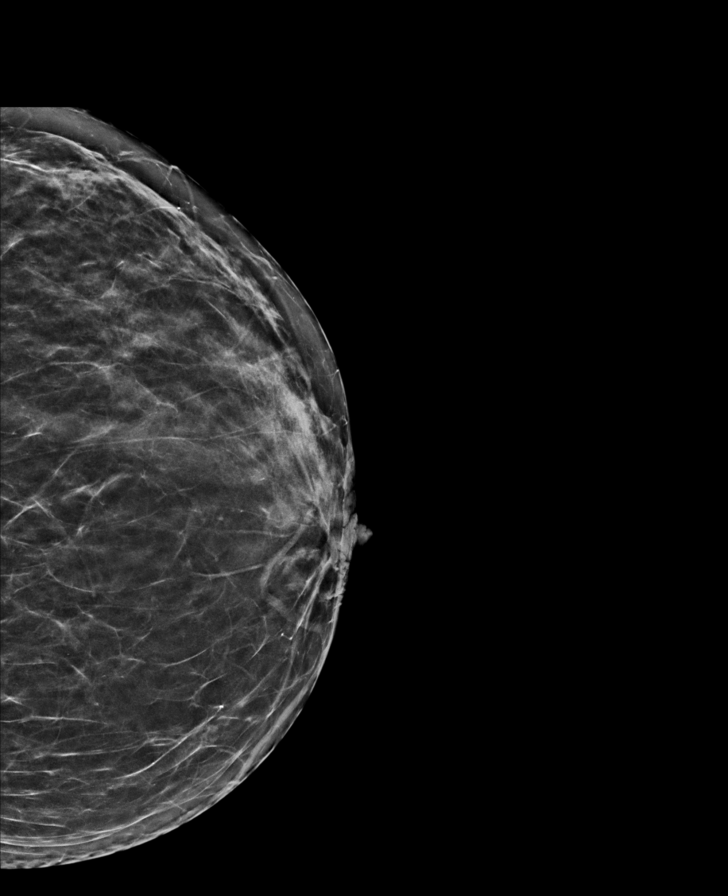

[R CC tomo · tomo slice 33/65.0]
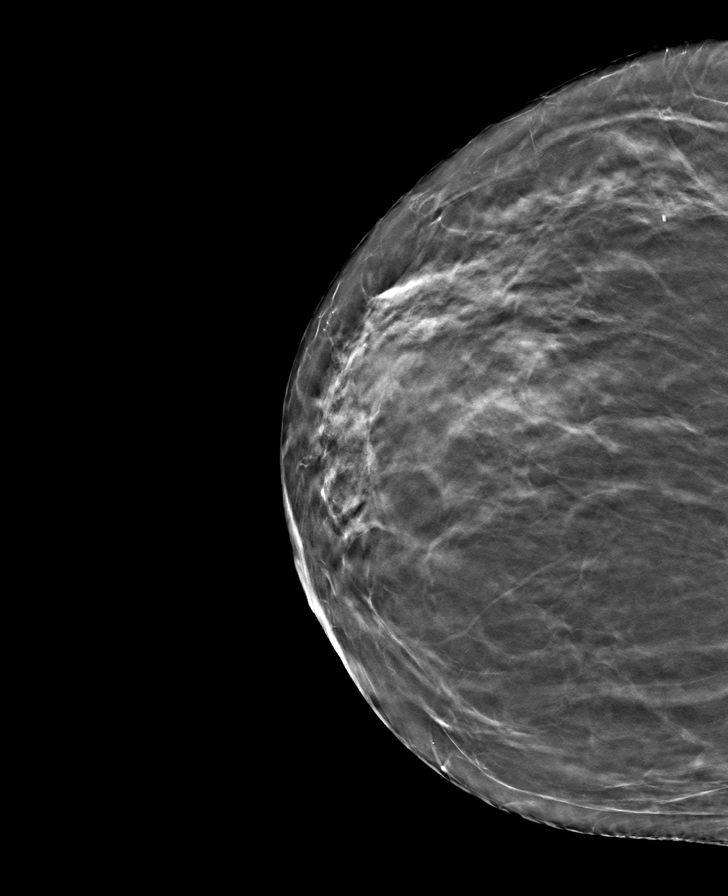

[8 of 40 positions shown; findings below may reference images not displayed]

ACR Breast Density Category b: There are scattered areas of
fibroglandular density.
FINDINGS: There are no findings suspicious for malignancy.
IMPRESSION: No mammographic evidence of malignancy. A result letter of this
screening mammogram will be mailed directly to the patient.

RECOMMENDATION:
Screening mammogram in one year. (Code:51-O-LD2)

BI-RADS CATEGORY  1: Negative.

## 2023-04-27 ENCOUNTER — Telehealth: Payer: Self-pay | Admitting: Orthopedic Surgery

## 2023-04-27 NOTE — Telephone Encounter (Signed)
 Patient called and said she is having issues with her heart and has an appointment for the heart doctor two days before her surgery and wants you to know if she should have the surgery or not.[phone]

## 2023-04-28 ENCOUNTER — Telehealth: Payer: Self-pay | Admitting: Surgical

## 2023-04-28 NOTE — Telephone Encounter (Signed)
 I called - pls cx surgery for now thx

## 2023-05-10 ENCOUNTER — Ambulatory Visit: Payer: Medicare Other | Attending: Internal Medicine | Admitting: Internal Medicine

## 2023-05-10 NOTE — Progress Notes (Signed)
 Erroneous encounter - please disregard.

## 2023-05-12 ENCOUNTER — Ambulatory Visit (HOSPITAL_COMMUNITY): Admission: RE | Admit: 2023-05-12 | Payer: Medicare Other | Source: Home / Self Care | Admitting: Orthopedic Surgery

## 2023-05-12 ENCOUNTER — Encounter (HOSPITAL_COMMUNITY): Admission: RE | Payer: Self-pay | Source: Home / Self Care

## 2023-05-12 DIAGNOSIS — Z01818 Encounter for other preprocedural examination: Secondary | ICD-10-CM

## 2023-05-12 SURGERY — ARTHROPLASTY, SHOULDER, TOTAL, REVERSE
Anesthesia: General | Site: Shoulder | Laterality: Left

## 2023-05-27 ENCOUNTER — Encounter: Payer: Medicare Other | Admitting: Surgical

## 2023-07-04 ENCOUNTER — Other Ambulatory Visit (HOSPITAL_COMMUNITY): Payer: Self-pay | Admitting: Internal Medicine

## 2023-07-04 ENCOUNTER — Encounter (HOSPITAL_COMMUNITY): Payer: Self-pay | Admitting: Internal Medicine

## 2023-07-04 DIAGNOSIS — N63 Unspecified lump in unspecified breast: Secondary | ICD-10-CM

## 2023-07-04 DIAGNOSIS — R928 Other abnormal and inconclusive findings on diagnostic imaging of breast: Secondary | ICD-10-CM

## 2023-07-21 ENCOUNTER — Other Ambulatory Visit (HOSPITAL_COMMUNITY): Payer: Self-pay | Admitting: Internal Medicine

## 2023-07-21 ENCOUNTER — Ambulatory Visit (HOSPITAL_COMMUNITY)
Admission: RE | Admit: 2023-07-21 | Discharge: 2023-07-21 | Disposition: A | Discharge status: Home / Self Care | Source: Ambulatory Visit | Attending: Internal Medicine | Admitting: Internal Medicine

## 2023-07-21 DIAGNOSIS — N63 Unspecified lump in unspecified breast: Secondary | ICD-10-CM | POA: Insufficient documentation

## 2023-07-21 DIAGNOSIS — R928 Other abnormal and inconclusive findings on diagnostic imaging of breast: Secondary | ICD-10-CM

## 2023-09-12 ENCOUNTER — Ambulatory Visit: Attending: Internal Medicine | Admitting: Internal Medicine

## 2023-09-12 ENCOUNTER — Encounter: Payer: Self-pay | Admitting: Internal Medicine

## 2023-09-12 VITALS — BP 118/70 | HR 59 | Ht 66.0 in | Wt 234.0 lb

## 2023-09-12 DIAGNOSIS — E785 Hyperlipidemia, unspecified: Secondary | ICD-10-CM | POA: Insufficient documentation

## 2023-09-12 DIAGNOSIS — R002 Palpitations: Secondary | ICD-10-CM | POA: Diagnosis not present

## 2023-09-12 DIAGNOSIS — E7849 Other hyperlipidemia: Secondary | ICD-10-CM | POA: Diagnosis not present

## 2023-09-12 NOTE — Progress Notes (Signed)
 Cardiology Office Note  Date: 09/12/2023   ID: Dana Holder, DOB July 25, 1955, MRN [id number]  PCP:  Dana Satterfield, MD  Cardiologist:  Dana SHAUNNA Maywood, MD Electrophysiologist:  None   History of Present Illness: Dana Holder is a 68 y.o. female  Referred to cardiology clinic for evaluation of palpitations.  Patient reported that since getting COVID shot in 2021, her brain has been foggy. Not able to remember anything in the short-term.  She is not able to provide me with any history of palpitations including the duration, frequency and the nature of her. The only complaint she has today is palpitations and not able to describe me if she has any fast heart rate or skipping a beat.  No angina, DOE.  No dizziness, lightheadedness, syncope or leg swelling.  Past Medical History:  Diagnosis Date   Family history of brain cancer    Family history of colon cancer    Family history of kidney cancer    Family history of leukemia    Family history of lung cancer    Family history of ovarian cancer    Family history of pancreatic cancer    Family history of prostate cancer    Family history of stomach cancer    Family history of throat cancer    Gout    Hypercholesteremia    Hypercholesteremia    Hypothyroidism     Past Surgical History:  Procedure Laterality Date   ANTERIOR CERVICAL DECOMPRESSION/DISCECTOMY FUSION 4 LEVELS N/A 05/06/2021   Procedure: CERVICAL THREE-FOUR, CERVICAL FOUR-FIVE, CERVICAL FIVE-SIX, CERVICAL SIX-SEVEN ANTERIOR CERVICAL DECOMPRESSION/DISCECTOMY FUSION;  Surgeon: Dana Kuba, MD;  Location: [facility];  Service: Neurosurgery;  Laterality: N/A;   BACK SURGERY  2002   lumbar disc, two surgeries   COLONOSCOPY N/A 12/08/2015   Procedure: COLONOSCOPY;  Surgeon: Dana LITTIE Haddock, MD;  [facility];  Service: Endoscopy;  Laterality: N/A;  9:15 AM   COLONOSCOPY WITH PROPOFOL  N/A 11/18/2021   Procedure: COLONOSCOPY WITH PROPOFOL ;  Surgeon: Dana Carlin POUR, DO;  [facility];  Service: Endoscopy;  Laterality: N/A;  10:15 am   POLYPECTOMY  11/18/2021   Procedure: POLYPECTOMY;  Surgeon: Dana Carlin POUR, DO;  [facility];  Service: Endoscopy;;   TUBAL LIGATION      Current Outpatient Medications  Medication Sig Dispense Refill   Cholecalciferol (VITAMIN D3) 250 MCG (10000 UT) TABS Take 10,000 Units by mouth daily.     levothyroxine  (SYNTHROID ) 88 MCG tablet Take 88 mcg by mouth daily before breakfast.     rosuvastatin (CRESTOR) 10 MG tablet Take 10 mg by mouth daily.     No current facility-administered medications for this visit.   Allergies:  Morphine   Social History: The patient  reports that she quit smoking about 21 years ago. Her smoking use included cigarettes. She started smoking about 51 years ago. She has a 15 pack-year smoking history. She has never used smokeless tobacco. She reports that she does not drink alcohol and does not use drugs.   Family History: The patient's family history includes Acute myelogenous leukemia in her son; Brain cancer in her maternal aunt; Colon cancer in her cousin; Heart attack in her maternal grandmother; Kidney cancer in her cousin; Lung cancer in her maternal uncle; Lung cancer (age of onset: 17) in her brother; Ovarian cancer (age of onset: 51) in her sister; Pancreatic cancer (age of onset: 17) in her brother; Prostate cancer (age of onset: 14)  in her cousin; Stomach cancer in her maternal aunt; Throat cancer (age of onset: 41) in her father.   ROS:  Please see the history of present illness. Otherwise, complete review of systems is positive for none  All other systems are reviewed and negative.   Physical Exam: VS:  BP 118/70 (BP Location: Right Arm, Cuff Size: Large)   Pulse (!) 59   Ht 5' 6 (1.676 m)   Wt 234 lb (106.1 kg)   SpO2 95%   BMI 37.77 kg/m , BMI Body mass index is 37.77 kg/m.  Wt Readings from Last 3 Encounters:  09/12/23 234 lb (106.1 kg)   11/18/21 228 lb 9.9 oz (103.7 kg)  10/23/21 228 lb 9.9 oz (103.7 kg)    General: Patient appears comfortable at rest. HEENT: Conjunctiva and lids normal, oropharynx clear with moist mucosa. Neck: Supple, no elevated JVP or carotid bruits, no thyromegaly. Lungs: Clear to auscultation, nonlabored breathing at rest. Cardiac: Regular rate and rhythm, no S3 or significant systolic murmur, no pericardial rub. Abdomen: Soft, nontender, no hepatomegaly, bowel sounds present, no guarding or rebound. Extremities: No pitting edema, distal pulses 2+. Skin: Warm and dry. Musculoskeletal: No kyphosis. Neuropsychiatric: Alert and oriented x3, affect grossly appropriate.  Recent Labwork: No results found for requested labs within last 365 days.  No results found for: CHOL, TRIG, HDL, CHOLHDL, VLDL, LDLCALC, LDLDIRECT  Other Studies Reviewed Today:   Assessment and Plan:  Palpitations - Patient is not able to provide me with any history of palpitations (due to short-term memory loss) including the duration, frequency and the nature of her. The only complaint she has today is palpitations and not able to describe me if she has any fast heart rate or skipping a beat.   - I discussed about obtaining a 2-week event monitor but she refused stating that she has no money.  I discussed about having the patient purchase Kardia mobile or smart watch with EKG lead functionality to monitor her heart rate and rhythm if event monitor was expensive.  Patient stated that her husband will return back on Friday and that she will discuss with him about the best approach.  She can call the clinic after her decision and we will mail her 2-week event monitor if she wants to go with that.  HLD, unknown values - Continue rosuvastatin 10 mg nightly.  Goal LDL less than 100.     Medication Adjustments/Labs and Tests Ordered: Current medicines are reviewed at length with the patient today.  Concerns regarding  medicines are outlined above.    Disposition:  Follow up prn  Signed Faris Coolman Arleta Maywood, MD, 09/12/2023 3:48 PM    [facility] [address] [place], [place], [place] [address]

## 2023-09-12 NOTE — Patient Instructions (Addendum)
 Medication Instructions:  Your physician recommends that you continue on your current medications as directed. Please refer to the Current Medication list given to you today.   Labwork: None  Testing/Procedures: None  Follow-Up: Your physician recommends that you schedule a follow-up appointment in: Follow up as needed  Any Other Special Instructions Will Be Listed Below (If Applicable). Dr. Mallipeddi has recommended the following options for your: A Smart Watch with EKG functionality Wearing a 2 week monitor which we order from [employer]   Kardia Mobile [employer]: Website: [email or web address]  DR. LONNI RECOMMENDS YOU PURCHASE   Kardia [employer]  [employer] FROM THE  [employer]  APP PLAY STORE.  THE APP IS FREE , BUT THE  EQUIPMENT HAS A COST. IT ALLOWS YOU TO OBTAIN A RECORDING OF YOUR HEART RATE AND RHYTHM BY PROVIDING A SHORT STRIP THAT YOU CAN SHARE WITH YOUR PROVIDER.     Thank you for choosing [facility]!       If you need a refill on your cardiac medications before your next appointment, please call your pharmacy.

## 2023-09-20 ENCOUNTER — Telehealth: Payer: Self-pay | Admitting: Internal Medicine

## 2023-09-20 NOTE — Telephone Encounter (Signed)
 Pt came in Dana Holder office and said she needed to talk to Dr.Mallipeddi. She has decided to do what Dr.Mallipeddi has recommend but is unsure what that is. All she knows is that is cost $200

## 2023-09-20 NOTE — Telephone Encounter (Signed)
 Message sent to [employer] rep - awaiting reply.

## 2023-09-21 NOTE — Telephone Encounter (Signed)
 Informed patient that I heard back from our [employer] rep.  Rep advised that patient can contact our Patient Financial Navigators directly to request a benefit check and receive an estimated cost & from there, the navigators can also walk the patient through any additional coverage options if needed.  Patient stated that was too much & she was not going to do that.  Says she just wants to wear the monitor & verbalized understanding that we can not confirm that her amount to pay will be $200.00 after her insurance.    Patient will come on Tuesday, 09/27/23 at 8:30 am to have monitor put on.

## 2023-09-27 ENCOUNTER — Other Ambulatory Visit: Payer: Self-pay | Admitting: Internal Medicine

## 2023-09-27 ENCOUNTER — Telehealth: Payer: Self-pay | Admitting: Internal Medicine

## 2023-09-27 ENCOUNTER — Ambulatory Visit: Attending: Internal Medicine

## 2023-09-27 DIAGNOSIS — R002 Palpitations: Secondary | ICD-10-CM

## 2023-09-27 NOTE — Telephone Encounter (Signed)
 Checking percert on the following patient for   LONG TERM MONITOR (3-14 DAYS)

## 2023-11-01 ENCOUNTER — Inpatient Hospital Stay (HOSPITAL_COMMUNITY): Admission: RE | Admit: 2023-11-01 | Source: Ambulatory Visit

## 2023-11-01 ENCOUNTER — Ambulatory Visit (HOSPITAL_COMMUNITY)

## 2023-11-01 ENCOUNTER — Telehealth: Payer: Self-pay | Admitting: Internal Medicine

## 2023-11-01 ENCOUNTER — Encounter (HOSPITAL_COMMUNITY): Payer: Self-pay

## 2023-11-01 NOTE — Telephone Encounter (Signed)
 Pt can not her into mychart and would like to know about her heart monitor results.   Best phone number [phone]

## 2023-11-02 NOTE — Telephone Encounter (Signed)
 Left message for patient to call back

## 2023-11-07 NOTE — Telephone Encounter (Signed)
 Per D. Mallipeddi: Normal event monitor. Symptoms correlated with NSR.   The patient has been notified of the result and verbalized understanding.  All questions (if any) were answered. Littie CHRISTELLA Croak, CMA 11/07/2023 11:36 AM

## 2023-11-10 ENCOUNTER — Telehealth: Payer: Self-pay | Admitting: Internal Medicine

## 2023-11-10 NOTE — Telephone Encounter (Signed)
 The patient has been notified of the result and verbalized understanding.  All questions (if any) were answered. Littie CHRISTELLA Croak, CMA 11/10/2023 11:29 AM

## 2023-11-10 NOTE — Telephone Encounter (Signed)
 PT is up front in lobby she said she came up here for someone to go over her heart monitor results. she cant find the number to call. Vitalia Spoke with her on 10/6

## 2023-11-11 ENCOUNTER — Ambulatory Visit: Payer: Self-pay | Admitting: Internal Medicine

## 2023-11-11 DIAGNOSIS — R002 Palpitations: Secondary | ICD-10-CM

## 2023-11-16 ENCOUNTER — Other Ambulatory Visit (HOSPITAL_COMMUNITY): Payer: Self-pay | Admitting: Internal Medicine

## 2023-11-16 DIAGNOSIS — N6489 Other specified disorders of breast: Secondary | ICD-10-CM

## 2023-11-17 ENCOUNTER — Ambulatory Visit (HOSPITAL_COMMUNITY)
Admission: RE | Admit: 2023-11-17 | Discharge: 2023-11-17 | Disposition: A | Discharge status: Home / Self Care | Source: Ambulatory Visit | Attending: Internal Medicine | Admitting: Internal Medicine

## 2023-11-17 DIAGNOSIS — N6489 Other specified disorders of breast: Secondary | ICD-10-CM | POA: Diagnosis present

## 2023-12-05 ENCOUNTER — Encounter: Payer: Self-pay | Admitting: Radiology

## 2024-02-15 ENCOUNTER — Encounter: Payer: Self-pay | Admitting: Orthopedic Surgery

## 2024-02-15 ENCOUNTER — Ambulatory Visit: Admitting: Orthopedic Surgery

## 2024-02-15 DIAGNOSIS — M19012 Primary osteoarthritis, left shoulder: Secondary | ICD-10-CM

## 2024-02-15 NOTE — Progress Notes (Signed)
 "  Office Visit Note   Patient: Dana Holder           Date of Birth: [birth year]           MRN: [id number] Visit Date: 02/15/2024 Requested by: Bertell Satterfield, MD [address] [place],  [place] [address] PCP: Bertell Satterfield, MD  Subjective: Chief Complaint  Patient presents with   Left Shoulder - Pain    Wants to discuss surgery    HPI: Dana Holder is a 69 y.o. female who presents to the office reporting left shoulder pain.  Patient is right-hand dominant.  She reports constant pain in that left shoulder region with waxing and waning severity.  Pain does wake her from sleep at night.  Pain does wake her from sleep at night.  No history with cardiac issues.  Husband works out of town so she lives alone most of the time.  Does have a grandson in the home.  Tylenol  has not been helpful anymore.  Patient states I have COVID brain.  Her best friend Dana Holder is present.  She has had a CT scan from 2024 which shows moderate glenohumeral degenerative changes with joint space narrowing osteophytes and subchondral cyst formation in the humeral head and glenoid.  Also has previous C3-C7 ACDF..                ROS: All systems reviewed are negative as they relate to the chief complaint within the history of present illness.  Patient denies fevers or chills.  Assessment & Plan: Visit Diagnoses:  1. Primary osteoarthritis, left shoulder     Plan: Impression is left shoulder arthritis.  She has pretty well-maintained motion in that shoulder and no real radicular symptoms.  Plan is thin cut CT scan for preop planning for left reverse shoulder replacement.  Cardiac restratification indicated.  The risk and benefits of reverse shoulder replacement are discussed with the patient include not limited to infection nerve vessel damage incomplete pain relief as well as incomplete restoration of function.  The extensive nature of the rehabilitative process was also discussed.   Instability also risk.  All questions answered.  Follow-Up Instructions: No follow-ups on file.   Orders:  Orders Placed This Encounter  Procedures   CT SHOULDER LEFT WO CONTRAST   No orders of the defined types were placed in this encounter.     Procedures: No procedures performed   Clinical Data: No additional findings.  Objective: Vital Signs: There were no vitals taken for this visit.  Physical Exam:  Constitutional: Patient appears well-developed HEENT:  Head: Normocephalic Eyes:EOM are normal Neck: Normal range of motion Cardiovascular: Normal rate Pulmonary/chest: Effort normal Neurologic: Patient is alert Skin: Skin is warm Psychiatric: Patient has normal mood and affect  Ortho Exam: Ortho exam demonstrates range of motion of the left of 60/100/160.  Fairly reasonable strength to infraspinatus to supraspinatus and subscap muscle testing.  No discrete AC joint tenderness.  Cervical spine range of motion reduced proportionate to the patient's rather extensive history of cervical spine surgery.  No definite paresthesias left arm versus right arm.  Multiple no other masses lymphadenopathy or skin changes noted in that shoulder girdle region.  Radial pulses intact.  Deltoid is functional.  Specialty Comments:  MRI left shoulder.    IMPRESSION: 1. Mild tendinosis of the supraspinatus, infraspinatus and subscapularis tendons. 2. Mild tendinosis of the intra-articular portion of the long head of the biceps tendon. 3. Moderate-severe osteoarthritis of the glenohumeral  joint.     Electronically Signed   By: Julaine Blanch M.D.   On: 03/13/2022 06:43  Imaging: No results found.   PMFS History: Patient Active Problem List   Diagnosis Date Noted   Palpitations 09/12/2023   HLD (hyperlipidemia) 09/12/2023   ERRONEOUS ENCOUNTER--DISREGARD 05/10/2023   Pain of left shoulder region 05/26/2022   History of adenomatous polyp of colon 09/29/2021   Spinal stenosis of  cervical region 05/06/2021   Family history of pancreatic cancer    Family history of ovarian cancer    Family history of leukemia    Family history of lung cancer    Family history of throat cancer    Family history of brain cancer    Family history of stomach cancer    Family history of colon cancer    Family history of kidney cancer    Family history of prostate cancer    Spinal stenosis at L4-L5 level 07/21/2018   Constipation 11/17/2015   Special screening for malignant neoplasms, colon 11/17/2015   Past Medical History:  Diagnosis Date   Family history of brain cancer    Family history of colon cancer    Family history of kidney cancer    Family history of leukemia    Family history of lung cancer    Family history of ovarian cancer    Family history of pancreatic cancer    Family history of prostate cancer    Family history of stomach cancer    Family history of throat cancer    Gout    Hypercholesteremia    Hypercholesteremia    Hypothyroidism     Family History  Problem Relation Age of Onset   Acute myelogenous leukemia Son    Throat cancer Father 12   Ovarian cancer Sister 24   Lung cancer Brother 71       non-small cell   Brain cancer Maternal Aunt        dx. in her 59s   Lung cancer Maternal Uncle        dx. in his 80s or 64s, smoker   Heart attack Maternal Grandmother    Pancreatic cancer Brother 4   Stomach cancer Maternal Aunt        dx. in her 56s   Colon cancer Cousin        dx. >50 - maternal cousin   Kidney cancer Cousin        dx. in his 56s - maternal cousin   Prostate cancer Cousin 97       maternal cousin   Anesthesia problems Neg Hx    Hypotension Neg Hx    Malignant hyperthermia Neg Hx    Pseudochol deficiency Neg Hx     Past Surgical History:  Procedure Laterality Date   ANTERIOR CERVICAL DECOMPRESSION/DISCECTOMY FUSION 4 LEVELS N/A 05/06/2021   Procedure: CERVICAL THREE-FOUR, CERVICAL FOUR-FIVE, CERVICAL FIVE-SIX, CERVICAL  SIX-SEVEN ANTERIOR CERVICAL DECOMPRESSION/DISCECTOMY FUSION;  Surgeon: Onetha Kuba, MD;  Location: [facility];  Service: Neurosurgery;  Laterality: N/A;   BACK SURGERY  2002   lumbar disc, two surgeries   COLONOSCOPY N/A 12/08/2015   Procedure: COLONOSCOPY;  Surgeon: Margo LITTIE Haddock, MD;  [facility];  Service: Endoscopy;  Laterality: N/A;  9:15 AM   COLONOSCOPY WITH PROPOFOL  N/A 11/18/2021   Procedure: COLONOSCOPY WITH PROPOFOL ;  Surgeon: Cindie Carlin POUR, DO;  [facility];  Service: Endoscopy;  Laterality: N/A;  10:15 am   POLYPECTOMY  11/18/2021  Procedure: POLYPECTOMY;  Surgeon: Cindie Carlin POUR, DO;  [facility];  Service: Endoscopy;;   TUBAL LIGATION     Social History   Occupational History   Not on file  Tobacco Use   Smoking status: Former    Current packs/day: 0.00    Average packs/day: 0.5 packs/day for 30.0 years (15.0 ttl pk-yrs)    Types: Cigarettes    Start date: 06/20/1972    Quit date: 06/21/2002    Years since quitting: 21.6   Smokeless tobacco: Never  Vaping Use   Vaping status: Never Used  Substance and Sexual Activity   Alcohol use: No   Drug use: No   Sexual activity: Yes    Birth control/protection: Post-menopausal        "

## 2024-04-09 ENCOUNTER — Other Ambulatory Visit

## 2024-05-03 ENCOUNTER — Ambulatory Visit (HOSPITAL_COMMUNITY): Admit: 2024-05-03 | Admitting: Orthopedic Surgery

## 2024-05-14 ENCOUNTER — Other Ambulatory Visit

## 2024-05-18 ENCOUNTER — Encounter: Admitting: Surgical
